# Patient Record
Sex: Male | Born: 1940 | Race: White | Hispanic: No | Smoking: Current every day smoker
Health system: Southern US, Community
[De-identification: ages and names within clinical notes are randomized; demographics above are authoritative.]

## PROBLEM LIST (undated history)

## (undated) DIAGNOSIS — J449 Chronic obstructive pulmonary disease, unspecified: Secondary | ICD-10-CM

## (undated) DIAGNOSIS — I10 Essential (primary) hypertension: Secondary | ICD-10-CM

## (undated) DIAGNOSIS — I2510 Atherosclerotic heart disease of native coronary artery without angina pectoris: Secondary | ICD-10-CM

## (undated) DIAGNOSIS — I219 Acute myocardial infarction, unspecified: Secondary | ICD-10-CM

## (undated) HISTORY — PX: BACK SURGERY: SHX140

## (undated) HISTORY — PX: CARDIAC SURGERY: SHX584

## (undated) HISTORY — PX: HIP SURGERY: SHX245

---

## 2006-11-13 ENCOUNTER — Emergency Department: Payer: Self-pay | Admitting: Internal Medicine

## 2006-11-18 ENCOUNTER — Inpatient Hospital Stay: Payer: Self-pay | Admitting: Internal Medicine

## 2006-11-21 ENCOUNTER — Other Ambulatory Visit: Payer: Self-pay

## 2006-11-23 ENCOUNTER — Other Ambulatory Visit: Payer: Self-pay

## 2006-12-18 ENCOUNTER — Ambulatory Visit: Payer: Self-pay | Admitting: Emergency Medicine

## 2007-04-02 ENCOUNTER — Emergency Department: Payer: Self-pay | Admitting: Emergency Medicine

## 2007-04-14 ENCOUNTER — Ambulatory Visit: Payer: Self-pay | Admitting: Unknown Physician Specialty

## 2007-05-13 ENCOUNTER — Ambulatory Visit: Payer: Self-pay | Admitting: Internal Medicine

## 2009-05-19 ENCOUNTER — Ambulatory Visit: Payer: Self-pay | Admitting: Unknown Physician Specialty

## 2009-05-22 ENCOUNTER — Inpatient Hospital Stay: Payer: Self-pay | Admitting: Unknown Physician Specialty

## 2012-12-30 ENCOUNTER — Emergency Department: Payer: Self-pay | Admitting: Emergency Medicine

## 2012-12-31 LAB — BASIC METABOLIC PANEL
Anion Gap: 6 — ABNORMAL LOW (ref 7–16)
EGFR (Non-African Amer.): >60
Glucose: 107 mg/dL — ABNORMAL HIGH (ref 65–99)
Osmolality: 280 (ref 275–301)
Potassium: 3.7 mmol/L (ref 3.5–5.1)
Sodium: 140 mmol/L (ref 136–145)

## 2012-12-31 LAB — CBC
HCT: 40.2 % (ref 40.0–52.0)
MCHC: 34.4 g/dL (ref 32.0–36.0)
MCV: 89 fL (ref 80–100)
Platelet: 135 10*3/uL — ABNORMAL LOW (ref 150–440)
RBC: 4.51 10*6/uL (ref 4.40–5.90)

## 2012-12-31 LAB — PRO B NATRIURETIC PEPTIDE: B-Type Natriuretic Peptide: 386 pg/mL — ABNORMAL HIGH (ref 0–125)

## 2012-12-31 LAB — TROPONIN I: Troponin-I: <0.02 ng/mL

## 2013-01-05 LAB — CULTURE, BLOOD (SINGLE)

## 2014-02-15 ENCOUNTER — Emergency Department: Payer: Self-pay | Admitting: Emergency Medicine

## 2014-02-15 LAB — DRUG SCREEN, URINE

## 2014-02-15 LAB — COMPREHENSIVE METABOLIC PANEL
ALBUMIN: 3.6 g/dL (ref 3.4–5.0)
ALK PHOS: 100 U/L
ALT: 19 U/L
ANION GAP: 6 — AB (ref 7–16)
AST: 26 U/L (ref 15–37)
BILIRUBIN TOTAL: 0.5 mg/dL (ref 0.2–1.0)
BUN: 14 mg/dL (ref 7–18)
CALCIUM: 8.7 mg/dL (ref 8.5–10.1)
CHLORIDE: 112 mmol/L — AB (ref 98–107)
CO2: 24 mmol/L (ref 21–32)
Creatinine: 1.32 mg/dL — ABNORMAL HIGH (ref 0.60–1.30)
EGFR (Non-African Amer.): 56 — ABNORMAL LOW
GLUCOSE: 109 mg/dL — AB (ref 65–99)
Osmolality: 284 (ref 275–301)
POTASSIUM: 4.1 mmol/L (ref 3.5–5.1)
Sodium: 142 mmol/L (ref 136–145)
Total Protein: 7.7 g/dL (ref 6.4–8.2)

## 2014-02-15 LAB — URINALYSIS, COMPLETE
BLOOD: NEGATIVE
Bilirubin,UR: NEGATIVE
Glucose,UR: NEGATIVE mg/dL (ref 0–75)
Granular Cast: 6
Hyaline Cast: 63
NITRITE: NEGATIVE
Ph: 5 (ref 4.5–8.0)
Protein: 30
RBC,UR: 8 /HPF (ref 0–5)
SPECIFIC GRAVITY: 1.028 (ref 1.003–1.030)
WBC UR: 10 /HPF (ref 0–5)

## 2014-02-15 LAB — ETHANOL

## 2014-02-15 LAB — CBC
HCT: 45.3 % (ref 40.0–52.0)
HGB: 15 g/dL (ref 13.0–18.0)
MCH: 29.5 pg (ref 26.0–34.0)
MCHC: 33.1 g/dL (ref 32.0–36.0)
MCV: 89 fL (ref 80–100)
PLATELETS: 171 10*3/uL (ref 150–440)
RBC: 5.08 10*6/uL (ref 4.40–5.90)
RDW: 16.1 % — AB (ref 11.5–14.5)
WBC: 11.2 10*3/uL — ABNORMAL HIGH (ref 3.8–10.6)

## 2014-02-15 LAB — SALICYLATE LEVEL: SALICYLATES, SERUM: 2.6 mg/dL

## 2014-02-15 LAB — ACETAMINOPHEN LEVEL: Acetaminophen: <2 ug/mL

## 2014-05-03 ENCOUNTER — Emergency Department: Payer: Self-pay | Admitting: Emergency Medicine

## 2015-04-12 ENCOUNTER — Emergency Department
Admission: EM | Admit: 2015-04-12 | Discharge: 2015-04-14 | Disposition: A | Discharge status: Other Acute Inpatient Hospital | Payer: Medicare Other | Attending: Emergency Medicine | Admitting: Emergency Medicine

## 2015-04-12 ENCOUNTER — Encounter: Payer: Self-pay | Admitting: Emergency Medicine

## 2015-04-12 DIAGNOSIS — R45851 Suicidal ideations: Secondary | ICD-10-CM | POA: Diagnosis present

## 2015-04-12 DIAGNOSIS — Z7951 Long term (current) use of inhaled steroids: Secondary | ICD-10-CM | POA: Insufficient documentation

## 2015-04-12 DIAGNOSIS — Z7952 Long term (current) use of systemic steroids: Secondary | ICD-10-CM | POA: Diagnosis not present

## 2015-04-12 DIAGNOSIS — M25559 Pain in unspecified hip: Secondary | ICD-10-CM

## 2015-04-12 DIAGNOSIS — F322 Major depressive disorder, single episode, severe without psychotic features: Secondary | ICD-10-CM

## 2015-04-12 DIAGNOSIS — I10 Essential (primary) hypertension: Secondary | ICD-10-CM | POA: Insufficient documentation

## 2015-04-12 DIAGNOSIS — G8929 Other chronic pain: Secondary | ICD-10-CM

## 2015-04-12 DIAGNOSIS — Z792 Long term (current) use of antibiotics: Secondary | ICD-10-CM | POA: Insufficient documentation

## 2015-04-12 DIAGNOSIS — Z79899 Other long term (current) drug therapy: Secondary | ICD-10-CM | POA: Diagnosis not present

## 2015-04-12 DIAGNOSIS — F17210 Nicotine dependence, cigarettes, uncomplicated: Secondary | ICD-10-CM | POA: Insufficient documentation

## 2015-04-12 DIAGNOSIS — J449 Chronic obstructive pulmonary disease, unspecified: Secondary | ICD-10-CM

## 2015-04-12 DIAGNOSIS — F332 Major depressive disorder, recurrent severe without psychotic features: Secondary | ICD-10-CM | POA: Diagnosis not present

## 2015-04-12 HISTORY — DX: Essential (primary) hypertension: I10

## 2015-04-12 HISTORY — DX: Atherosclerotic heart disease of native coronary artery without angina pectoris: I25.10

## 2015-04-12 HISTORY — DX: Chronic obstructive pulmonary disease, unspecified: J44.9

## 2015-04-12 LAB — CBC
HEMATOCRIT: 46.5 % (ref 40.0–52.0)
HEMOGLOBIN: 15.7 g/dL (ref 13.0–18.0)
MCH: 28.8 pg (ref 26.0–34.0)
MCHC: 33.8 g/dL (ref 32.0–36.0)
MCV: 85.2 fL (ref 80.0–100.0)
Platelets: 150 10*3/uL (ref 150–440)
RBC: 5.45 MIL/uL (ref 4.40–5.90)
RDW: 15.7 % — ABNORMAL HIGH (ref 11.5–14.5)
WBC: 12.4 10*3/uL — AB (ref 3.8–10.6)

## 2015-04-12 LAB — URINE DRUG SCREEN, QUALITATIVE (ARMC ONLY)
AMPHETAMINES, UR SCREEN: NOT DETECTED
BARBITURATES, UR SCREEN: NOT DETECTED
Benzodiazepine, Ur Scrn: NOT DETECTED
COCAINE METABOLITE, UR ~~LOC~~: NOT DETECTED
Cannabinoid 50 Ng, Ur ~~LOC~~: NOT DETECTED
MDMA (ECSTASY) UR SCREEN: NOT DETECTED
METHADONE SCREEN, URINE: NOT DETECTED
OPIATE, UR SCREEN: NOT DETECTED
Phencyclidine (PCP) Ur S: NOT DETECTED
TRICYCLIC, UR SCREEN: NOT DETECTED

## 2015-04-12 LAB — COMPREHENSIVE METABOLIC PANEL
ALBUMIN: 4.2 g/dL (ref 3.5–5.0)
ALK PHOS: 113 U/L (ref 38–126)
ALT: 22 U/L (ref 17–63)
ANION GAP: 7 (ref 5–15)
AST: 26 U/L (ref 15–41)
BUN: 13 mg/dL (ref 6–20)
CALCIUM: 9.2 mg/dL (ref 8.9–10.3)
CHLORIDE: 105 mmol/L (ref 101–111)
CO2: 26 mmol/L (ref 22–32)
Creatinine, Ser: 1.32 mg/dL — ABNORMAL HIGH (ref 0.61–1.24)
GFR calc non Af Amer: 51 mL/min — ABNORMAL LOW (ref >60)
GFR, EST AFRICAN AMERICAN: 59 mL/min — AB (ref >60)
GLUCOSE: 112 mg/dL — AB (ref 65–99)
Potassium: 4.3 mmol/L (ref 3.5–5.1)
Sodium: 138 mmol/L (ref 135–145)
Total Bilirubin: 0.7 mg/dL (ref 0.3–1.2)
Total Protein: 7.7 g/dL (ref 6.5–8.1)

## 2015-04-12 LAB — ACETAMINOPHEN LEVEL

## 2015-04-12 LAB — SALICYLATE LEVEL

## 2015-04-12 LAB — ETHANOL: Alcohol, Ethyl (B): <5 mg/dL (ref <5)

## 2015-04-12 NOTE — ED Notes (Signed)
Housemates called 911 because patient was locked up in the backroom with a rope and talking about killing himself.  Patients daughter was shot and killed 2 days ago and he has been feeling suicidal.  Never had rope around his neck, was talked down by housemates.  Patient states that he wants the people he lives with out of his house as well.  Denies history of harming himself.

## 2015-04-13 DIAGNOSIS — F332 Major depressive disorder, recurrent severe without psychotic features: Secondary | ICD-10-CM | POA: Diagnosis not present

## 2015-04-13 DIAGNOSIS — Z792 Long term (current) use of antibiotics: Secondary | ICD-10-CM | POA: Diagnosis not present

## 2015-04-13 DIAGNOSIS — R45851 Suicidal ideations: Secondary | ICD-10-CM | POA: Diagnosis present

## 2015-04-13 DIAGNOSIS — F322 Major depressive disorder, single episode, severe without psychotic features: Secondary | ICD-10-CM | POA: Diagnosis not present

## 2015-04-13 DIAGNOSIS — I10 Essential (primary) hypertension: Secondary | ICD-10-CM

## 2015-04-13 DIAGNOSIS — J449 Chronic obstructive pulmonary disease, unspecified: Secondary | ICD-10-CM

## 2015-04-13 DIAGNOSIS — F17210 Nicotine dependence, cigarettes, uncomplicated: Secondary | ICD-10-CM | POA: Diagnosis not present

## 2015-04-13 DIAGNOSIS — G8929 Other chronic pain: Secondary | ICD-10-CM

## 2015-04-13 DIAGNOSIS — Z79899 Other long term (current) drug therapy: Secondary | ICD-10-CM | POA: Diagnosis not present

## 2015-04-13 DIAGNOSIS — Z7952 Long term (current) use of systemic steroids: Secondary | ICD-10-CM | POA: Diagnosis not present

## 2015-04-13 DIAGNOSIS — M25559 Pain in unspecified hip: Secondary | ICD-10-CM

## 2015-04-13 DIAGNOSIS — Z7951 Long term (current) use of inhaled steroids: Secondary | ICD-10-CM | POA: Diagnosis not present

## 2015-04-13 MED ORDER — ACETAMINOPHEN 325 MG PO TABS
650.0000 mg | ORAL_TABLET | Freq: Four times a day (QID) | ORAL | Status: DC | PRN
  Administered 2015-04-13: 650 mg via ORAL
  Filled 2015-04-13: qty 2

## 2015-04-13 MED ORDER — LISINOPRIL 20 MG PO TABS
40.0000 mg | ORAL_TABLET | Freq: Every day | ORAL | Status: DC
  Administered 2015-04-13: 40 mg via ORAL
  Filled 2015-04-13: qty 1

## 2015-04-13 MED ORDER — ALPRAZOLAM 0.5 MG PO TABS
0.5000 mg | ORAL_TABLET | ORAL | Status: AC
  Administered 2015-04-13: 0.5 mg via ORAL
  Filled 2015-04-13: qty 1

## 2015-04-13 MED ORDER — LORATADINE 10 MG PO TABS
10.0000 mg | ORAL_TABLET | Freq: Every day | ORAL | Status: DC
  Administered 2015-04-13: 10 mg via ORAL
  Filled 2015-04-13: qty 1

## 2015-04-13 MED ORDER — NICOTINE POLACRILEX 2 MG MT GUM: 4.0000 mg | CHEWING_GUM | OROMUCOSAL | Status: DC | PRN

## 2015-04-13 MED ORDER — GABAPENTIN 100 MG PO CAPS
100.0000 mg | ORAL_CAPSULE | Freq: Two times a day (BID) | ORAL | Status: DC
  Administered 2015-04-13 (×2): 100 mg via ORAL
  Filled 2015-04-13 (×4): qty 1

## 2015-04-13 MED ORDER — IPRATROPIUM-ALBUTEROL 0.5-2.5 (3) MG/3ML IN SOLN
3.0000 mL | RESPIRATORY_TRACT | Status: DC | PRN
  Administered 2015-04-13: 3 mL via RESPIRATORY_TRACT
  Filled 2015-04-13 (×2): qty 3

## 2015-04-13 MED ORDER — DULOXETINE HCL 30 MG PO CPEP
30.0000 mg | ORAL_CAPSULE | Freq: Every day | ORAL | Status: DC
  Administered 2015-04-13: 30 mg via ORAL
  Filled 2015-04-13: qty 1

## 2015-04-13 MED ORDER — NICOTINE POLACRILEX 2 MG MT GUM
2.0000 mg | CHEWING_GUM | OROMUCOSAL | Status: DC | PRN
  Administered 2015-04-13: 2 mg via ORAL
  Filled 2015-04-13: qty 1

## 2015-04-13 MED ORDER — PREDNISONE 20 MG PO TABS
20.0000 mg | ORAL_TABLET | Freq: Every day | ORAL | Status: DC
  Administered 2015-04-13: 20 mg via ORAL
  Filled 2015-04-13: qty 1

## 2015-04-13 NOTE — ED Notes (Signed)
Spoke with William LoserLaura Collins who presents to ED with a copy of Durable Power of [facility] for Mr William Wilcox. She reports pt has hx of dementia and is [facility] [place] [facility] (Dr Marcello FennelJakel). She also reports pt has recently lost his daughter who was murdered and since pt has been talking of harming himself. Information also given on patients home medications. All this information relayed to Dr Fanny BienQuale and to TTS.

## 2015-04-13 NOTE — ED Notes (Signed)

## 2015-04-13 NOTE — ED Notes (Signed)
Pt gave permission for this RN to retrieve wallet and envelope from pts belongings and give to POA, Bjorn LoserLaura Collins.

## 2015-04-13 NOTE — ED Notes (Signed)

## 2015-04-13 NOTE — ED Notes (Signed)
BEHAVIORAL HEALTH ROUNDING  Patient sleeping: No.  Patient alert and oriented: yes  Behavior appropriate: Yes. ; If no, describe:  Nutrition and fluids offered: Yes  Toileting and hygiene offered: Yes  Sitter present: not applicable, Q 15 min safety rounds and observation.  Law enforcement present: Yes ODS  

## 2015-04-13 NOTE — BH Specialist Note (Signed)
TTS spoke with [place], AOD at [facility].  [phone].  He states that [facility] [employer] [facility].  He states that no paperwork is being reviewed for beds at this time.

## 2015-04-13 NOTE — ED Notes (Signed)
BEHAVIORAL HEALTH ROUNDING Patient sleeping: Yes.   Patient alert and oriented: not applicable SLEEPING Behavior appropriate: Yes.  ; If no, describe: SLEEPING Nutrition and fluids offered: No SLEEPING Toileting and hygiene offered: NoSLEEPING Sitter present: not applicable, Q 15 min safety rounds and observation. Law enforcement present: Yes ODS 

## 2015-04-13 NOTE — ED Notes (Signed)
Patient resting quietly in room. No noted distress or abnormal behaviors noted. Will continue 15 minute checks and observation by security camera for safety. 

## 2015-04-13 NOTE — ED Notes (Signed)
Report received from Amy RN  

## 2015-04-13 NOTE — ED Notes (Signed)

## 2015-04-13 NOTE — BH Assessment (Signed)
Per Dr. PucilowsJennet Wilcox - patient meets criteria for inpatient hospitalization.  Writer referred patient to [facility].

## 2015-04-13 NOTE — BH Assessment (Addendum)
Writer referred the [facility] to the [facility].  Per William Wilcox, referral can be faxed to [phone].  [facility] was informed by the [facility] that his entire SS number has to be listed in the referral.

## 2015-04-13 NOTE — ED Notes (Signed)
Breakfast tray given, patient awake and eating at this time.

## 2015-04-13 NOTE — ED Notes (Signed)
Patient's money retrieved to give to power of attorney. $171.00, counted and witnessed by two RNs.

## 2015-04-13 NOTE — Consult Note (Signed)
[facility] Consult   Reason for Consult:  Consult for this 75 year old man who presents to the hospital under petition because of suicidal thoughts and behavior Referring Physician:  Corky Downs Patient Identification: ADIS STURGILL MRN:  [id number] Principal Diagnosis: Major depressive disorder, single episode, severe without psychotic features [facility]) Diagnosis:   Patient Active Problem List   Diagnosis Date Noted  . Major depressive disorder, single episode, severe without psychotic features ([place]) [F32.2] 04/13/2015  . COPD (chronic obstructive pulmonary disease) ([place]) [J44.9] 04/13/2015  . 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 Hypertension [I10] 04/13/2015    Total Time spent with patient: 1 hour  Subjective:   DARIO YONO is a 75 y.o. male patient admitted with "they thought I was going to kill myself. My daughter died".  HPI:  Information from an interview with the patient, review of the chart, discussion with nursing staff and intake staff, review of labs and review with the emergency room physician. This 75 year old man was brought to the emergency room by law enforcement. Apparently the people who live with him called 911 because he was stating that he was going to hang himself. I asked him why they thought he was going to do that and he said it was because he had a cord wrapped around his neck. Apparently he actually told law enforcement that he was going to hang himself. Patient minimizes the seriousness of this but admits that his mood is terrible. His daughter died in a robbery earlier this week. He is overwhelmed with grief. For the last several days he has not been eating and not been sleeping. Stopped taking all of his medicine about a month ago by what he says. Has a hard time giving me a really clear time course of this but it sounds like he may have been having some depression even before his daughter died. He says he can't think clearly and feels like  his mind is just jumbled up he denies that he's been drinking or abusing drugs. Has not been taking prescribed medicine including what sounds like medicine prescribed for depression. Other major stresses include being unhappy with the people who live with them. Chronic pain in his hip. Patient tells me that he still feels like he has nothing to live for and admits that he told law enforcement that he was going to kill himself.  Social history: Patient has his own house apparently but he has people living in an whom he barely knows. He met them somewhere and just invited them to live with him. Now he regrets that and is angry with them. He has had 3 daughters one of whom just died earlier this week. His wife also died several years ago. It sounds like he is at least partially estranged from his other 2 daughters although he admits he has spoken to them this week. Patient is a Geologist, engineering. Gets his outpatient medical care at the [place].  Medical history: Patient primarily complains of chronic pain in his hip. Says that he had a hip replacement years ago and has been in pain ever since. Also high blood pressure and COPD and tells me that he has had a coronary bypass operation as well. He has said that he stopped taking all of his medicine a month ago.  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 Denies recent drinking and denies other drug use.  Past Psychiatric History: From what the  patient tells me he has been seen by a psychiatrist at the [facility] and was prescribed medication at the recommendation of his primary care doctor, but has never been admitted to a psychiatric hospital that he will admit to. Says that he's never tried to kill himself in the past. Can't remember any medicine he's been prescribed. Doesn't report a history of mania.  Risk to Self: Suicidal Ideation:  (Denies suicidal ideation, but others report suicidal  intent) Suicidal Intent:  (Denied, but others state that he has suicidal intent) Is patient at risk for suicide?: Yes Suicidal Plan?:  (To hang himself) Access to Means: Yes Specify Access to Suicidal Means: rope at home What has been your use of drugs/alcohol within the last 12 months?: denied use of alchol or drugs How many times?: 0 Other Self Harm Risks: Denied Triggers for Past Attempts: Unknown Intentional Self Injurious Behavior: None Risk to Others: Homicidal Ideation: No Thoughts of Harm to Others: No Current Homicidal Intent: No Current Homicidal Plan: No Access to Homicidal Means: No Identified Victim: None reported History of harm to others?: No Assessment of Violence: None Noted Violent Behavior Description: denied Does patient have access to weapons?: No Criminal Charges Pending?: No Does patient have a court date: No Prior Inpatient Therapy: Prior Inpatient Therapy: No Prior Outpatient Therapy: Prior Outpatient Therapy: No Does patient have an ACCT team?: No Does patient have Intensive In-House Services?  : No Does patient have [facility] services? : No Does patient have P4CC services?: No  Past Medical History:  Past Medical History  Diagnosis Date  . Hypertension   . COPD (chronic obstructive pulmonary disease) ([place])   . Coronary artery disease     Past Surgical History  Procedure Laterality Date  . Back surgery    . Cardiac surgery      triple bypass  . Hip surgery Left    Family History: No family history on file. Family Psychiatric  History: Denies any family history of mental illness Social History:  History  Alcohol Use No     History  Drug Use No    Social History   Social History  . Marital Status: Married    Spouse Name: N/A  . Number of Children: N/A  . Years of Education: N/A   Social History Main Topics  . Smoking status: Current Every Day Smoker -- 2.00 packs/day for 57 years    Types: Cigarettes  . Smokeless tobacco: None  .  Alcohol Use: No  . Drug Use: No  . Sexual Activity: Not Asked   Other Topics Concern  . None   Social History Narrative  . None   Additional Social History:    History of alcohol / drug use?: No history of alcohol / drug abuse (Denied)                     Allergies:  No Known Allergies  Labs:  Results for orders placed or performed during the hospital encounter of 04/12/15 (from the past 48 hour(s))  Comprehensive metabolic panel     Status: Abnormal   Collection Time: 04/12/15  9:21 PM  Result Value Ref Range   Sodium 138 135 - 145 mmol/L   Potassium 4.3 3.5 - 5.1 mmol/L   Chloride 105 101 - 111 mmol/L   CO2 26 22 - 32 mmol/L   Glucose, Bld 112 (H) 65 - 99 mg/dL   BUN 13 6 - 20 mg/dL   Creatinine, Ser 1.32 (H)  0.61 - 1.24 mg/dL   Calcium 9.2 8.9 - 10.3 mg/dL   Total Protein 7.7 6.5 - 8.1 g/dL   Albumin 4.2 3.5 - 5.0 g/dL   AST 26 15 - 41 U/L   ALT 22 17 - 63 U/L   Alkaline Phosphatase 113 38 - 126 U/L   Total Bilirubin 0.7 0.3 - 1.2 mg/dL   GFR calc non Af Amer 51 (L) >60 mL/min   GFR calc Af Amer 59 (L) >60 mL/min    Comment: (NOTE) The eGFR has been calculated using the CKD EPI equation. This calculation has not been validated in all clinical situations. eGFR's persistently <60 mL/min signify possible Chronic Kidney Disease.    Anion gap 7 5 - 15  Ethanol (ETOH)     Status: None   Collection Time: 04/12/15  9:21 PM  Result Value Ref Range   Alcohol, Ethyl (B) <5 <5 mg/dL    Comment:        LOWEST DETECTABLE LIMIT FOR SERUM ALCOHOL IS 5 mg/dL FOR MEDICAL PURPOSES ONLY   Salicylate level     Status: None   Collection Time: 04/12/15  9:21 PM  Result Value Ref Range   Salicylate Lvl <8.1 2.8 - 30.0 mg/dL  Acetaminophen level     Status: Abnormal   Collection Time: 04/12/15  9:21 PM  Result Value Ref Range   Acetaminophen (Tylenol), Serum <10 (L) 10 - 30 ug/mL    Comment:        THERAPEUTIC CONCENTRATIONS VARY SIGNIFICANTLY. A RANGE OF  10-30 ug/mL MAY BE AN EFFECTIVE CONCENTRATION FOR MANY PATIENTS. HOWEVER, SOME ARE BEST TREATED AT CONCENTRATIONS OUTSIDE THIS RANGE. ACETAMINOPHEN CONCENTRATIONS >150 ug/mL AT 4 HOURS AFTER INGESTION AND >50 ug/mL AT 12 HOURS AFTER INGESTION ARE OFTEN ASSOCIATED WITH TOXIC REACTIONS.   CBC     Status: Abnormal   Collection Time: 04/12/15  9:21 PM  Result Value Ref Range   WBC 12.4 (H) 3.8 - 10.6 K/uL   RBC 5.45 4.40 - 5.90 MIL/uL   Hemoglobin 15.7 13.0 - 18.0 g/dL   HCT 46.5 40.0 - 52.0 %   MCV 85.2 80.0 - 100.0 fL   MCH 28.8 26.0 - 34.0 pg   MCHC 33.8 32.0 - 36.0 g/dL   RDW 15.7 (H) 11.5 - 14.5 %   Platelets 150 150 - 440 K/uL  Urine Drug Screen, Qualitative ([facility] only)     Status: None   Collection Time: 04/12/15  9:21 PM  Result Value Ref Range   Tricyclic, Ur Screen NONE DETECTED NONE DETECTED   Amphetamines, Ur Screen NONE DETECTED NONE DETECTED   MDMA (Ecstasy)Ur Screen NONE DETECTED NONE DETECTED   Cocaine Metabolite,Ur [place] NONE DETECTED NONE DETECTED   Opiate, Ur Screen NONE DETECTED NONE DETECTED   Phencyclidine (PCP) Ur S NONE DETECTED NONE DETECTED   Cannabinoid 50 Ng, Ur [place] NONE DETECTED NONE DETECTED   Barbiturates, Ur Screen NONE DETECTED NONE DETECTED   Benzodiazepine, Ur Scrn NONE DETECTED NONE DETECTED   Methadone Scn, Ur NONE DETECTED NONE DETECTED    Comment: (NOTE) 856  Tricyclics, urine               Cutoff 1000 ng/mL 200  Amphetamines, urine             Cutoff 1000 ng/mL 300  MDMA (Ecstasy), urine           Cutoff 500 ng/mL 400  Cocaine Metabolite, urine       Cutoff 300 ng/mL  500  Opiate, urine                   Cutoff 300 ng/mL 600  Phencyclidine (PCP), urine      Cutoff 25 ng/mL 700  Cannabinoid, urine              Cutoff 50 ng/mL 800  Barbiturates, urine             Cutoff 200 ng/mL 900  Benzodiazepine, urine           Cutoff 200 ng/mL 1000 Methadone, urine                Cutoff 300 ng/mL 1100 1200 The urine drug screen provides only a  preliminary, unconfirmed 1300 analytical test result and should not be used for non-medical 1400 purposes. Clinical consideration and professional judgment should 1500 be applied to any positive drug screen result due to possible 1600 interfering substances. A more specific alternate chemical method 1700 must be used in order to obtain a confirmed analytical result.  1800 Gas chromato graphy / mass spectrometry (GC/MS) is the preferred 1900 confirmatory method.     Current Facility-Administered Medications  Medication Dose Route Frequency Provider Last Rate Last Dose  . acetaminophen (TYLENOL) tablet 650 mg  650 mg Oral Q6H PRN Delman Kitten, MD   650 mg at 04/13/15 0937  . ALPRAZolam (XANAX) tablet 0.5 mg  0.5 mg Oral STAT Gonzella Lex, MD      . DULoxetine (CYMBALTA) DR capsule 30 mg  30 mg Oral Daily Delman Kitten, MD   30 mg at 04/13/15 [phone]  . gabapentin (NEURONTIN) capsule 100 mg  100 mg Oral BID Delman Kitten, MD   100 mg at 04/13/15 5852  . ipratropium-albuterol (DUONEB) 0.5-2.5 (3) MG/3ML nebulizer solution 3 mL  3 mL Nebulization Q4H PRN Delman Kitten, MD   3 mL at 04/13/15 0950  . lisinopril (PRINIVIL,ZESTRIL) tablet 40 mg  40 mg Oral Daily Delman Kitten, MD   40 mg at 04/13/15 0937  . loratadine (CLARITIN) tablet 10 mg  10 mg Oral Daily Delman Kitten, MD   10 mg at 04/13/15 0939  . predniSONE (DELTASONE) tablet 20 mg  20 mg Oral Q breakfast Delman Kitten, MD   20 mg at 04/13/15 7782   Current Outpatient Prescriptions  Medication Sig Dispense Refill  . acetaminophen (TYLENOL) 325 MG tablet Take 650 mg by mouth.    Marland Kitchen albuterol (PROVENTIL HFA;VENTOLIN HFA) 108 (90 Base) MCG/ACT inhaler Inhale into the lungs.    Marland Kitchen ammonium lactate (LAC-HYDRIN) 12 % lotion Apply 1 application topically as needed for dry skin.    Marland Kitchen doxycycline (VIBRAMYCIN) 100 MG capsule Take 100 mg by mouth.    . DULoxetine (CYMBALTA) 30 MG capsule Take 30 mg by mouth daily.    Marland Kitchen gabapentin (NEURONTIN) 100 MG capsule Take 100 mg by  mouth.    Marland Kitchen guaiFENesin-dextromethorphan (ROBITUSSIN DM) 100-10 MG/5ML syrup Take 5 mLs by mouth.    Marland Kitchen ipratropium-albuterol (DUONEB) 0.5-2.5 (3) MG/3ML SOLN Take 3 mLs by nebulization every 4 (four) hours as needed.    Marland Kitchen lisinopril (PRINIVIL,ZESTRIL) 40 MG tablet Take 40 mg by mouth daily.    Marland Kitchen loratadine (CLARITIN) 10 MG tablet Take 10 mg by mouth daily.    . predniSONE (DELTASONE) 20 MG tablet Take 20 mg by mouth.    . simvastatin (ZOCOR) 80 MG tablet Take 80 mg by mouth every evening.    . triamcinolone cream (KENALOG) 0.1 %  Apply 1 application topically.      Musculoskeletal: Strength & Muscle Tone: decreased Gait & Station: normal Patient leans: N/A  Psychiatric Specialty Exam: Review of Systems  Constitutional: Negative.   Eyes: Negative.   Respiratory: Negative.   Cardiovascular: Negative.   Gastrointestinal: Negative.   Musculoskeletal: Negative.   Skin: Negative.   Neurological: Positive for headaches.  Psychiatric/Behavioral: Positive for depression, suicidal ideas and memory loss. Negative for hallucinations and substance abuse. The patient is nervous/anxious and has insomnia.     Blood pressure 139/69, pulse 77, temperature 98.6 F (37 C), temperature source Oral, resp. rate 18, height 5' 6" (1.676 m), weight 56.7 kg (125 lb), SpO2 94 %.Body mass index is 20.19 kg/(m^2).  General Appearance: Disheveled  Eye Contact::  Minimal  Speech:  Garbled  Volume:  Normal  Mood:  Depressed and Dysphoric  Affect:  Depressed and Labile  Thought Process:  A little bit scattered but he is able to give a pretty good history  Orientation:  Full (Time, Place, and Person)  Thought Content:  Negative  Suicidal Thoughts:  Yes.  with intent/plan  Homicidal Thoughts:  No  Memory:  Immediate;   Good Recent;   Fair Remote;   Fair  Judgement:  Impaired  Insight:  Shallow  Psychomotor Activity:  Restlessness  Concentration:  Fair  Recall:  [employer] of Knowledge:Fair  Language:  Fair  Akathisia:  No  Handed:  Right  AIMS (if indicated):     Assets:  Agricultural consultant Housing Resilience  ADL's:  Intact  Cognition: WNL  Sleep:      Treatment Plan Summary: Daily contact with patient to assess and evaluate symptoms and progress in treatment, Medication management and Plan 75 year old man presents with multiple symptoms of major depression. Mood has been down and depressed and hopeless probably for weeks but at least since his daughter died. He is not sleeping well if at all. He is not eating well or taking care of himself. He is no longer taking any of his medication despite serious medical problems. He indicated to his friends that he was going to hang himself and when law enforcement came to the house he repeated this claim to them and repeats it to me although he says that he would never actually follow through on it. Patient does not appear to be having psychotic symptoms but does appear to be overwhelmed and anxious. Multiple serious risk factors for suicide are present. Patient requires inpatient hospital level treatment. We have started him back on the list of medications presumably that we have confirmed from his outpatient pharmacy including Cymbalta. 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 Case discussed with TTS and we will try to contact the [place] [facility] system. If they have a bed available for them we will arrange transport to the [place] system. If they are not able to provide inpatient care we can admit him to our hospital as he is ambulatory and seems to be in otherwise reasonable health and not particularly demented. I have ordered a low dose of Xanax Times one because he is so anxious and distraught right now. Otherwise continue current medicine. Labs reviewed. Case discussed with ER doctor.  Disposition: Recommend psychiatric Inpatient admission when medically cleared. Supportive therapy provided about ongoing  stressors.  Kariem Wolfson 04/13/2015 3:30 PM

## 2015-04-13 NOTE — ED Notes (Signed)
Received report from Monica RN

## 2015-04-13 NOTE — BH Assessment (Signed)
Writer faxed referrals to  [facility]  [facility]  [facility]

## 2015-04-13 NOTE — ED Provider Notes (Signed)
[facility] Emergency Department Provider Note REMINDER - THIS NOTE IS NOT A FINAL MEDICAL RECORD UNTIL IT IS SIGNED. UNTIL THEN, THE CONTENT BELOW MAY REFLECT INFORMATION FROM A DOCUMENTATION TEMPLATE, NOT THE ACTUAL PATIENT VISIT. ____________________________________________  Time seen: Approximately 12:34 AM  I have reviewed the triage vital signs and the nursing notes.   HISTORY  Chief Complaint Suicidal    HPI William Wilcox is a 75 y.o. male presents for evaluation of suicidal plan, potential attempt. The patient lost his daughter to a homicide 2 days ago which was will published in her local knees favor.  Reports he has been very depressed, feeling suicidal and then today had wanted to hang himself with a dog leash. The present time he states that he thinks this was foolish and no longer wishes to harm himself, but he does report he feels very sad and is depressed as lost his daughter.  He denies any fevers chills or recent concerns. Review his records does show that he is currently on prednisone, and I will continue this. He denies any cough fevers chills or trouble breathing at this time. He is followed at the [facility] for severe depression.  [place] overdose. Past Medical History  Diagnosis Date  . Hypertension   . COPD (chronic obstructive pulmonary disease) (HCC)   . Coronary artery disease     There are no active problems to display for this patient.   Past Surgical History  Procedure Laterality Date  . Back surgery    . Cardiac surgery      triple bypass  . Hip surgery Left     Current Outpatient Rx  Name  Route  Sig  Dispense  Refill  . acetaminophen (TYLENOL) 325 MG tablet   Oral   Take 650 mg by mouth.         Marland Kitchen albuterol (PROVENTIL HFA;VENTOLIN HFA) 108 (90 Base) MCG/ACT inhaler   Inhalation   Inhale into the lungs.         Marland Kitchen ammonium lactate (LAC-HYDRIN) 12 % lotion   Topical   Apply 1 application topically  as needed for dry skin.         Marland Kitchen doxycycline (VIBRAMYCIN) 100 MG capsule   Oral   Take 100 mg by mouth.         . DULoxetine (CYMBALTA) 30 MG capsule   Oral   Take 30 mg by mouth daily.         Marland Kitchen gabapentin (NEURONTIN) 100 MG capsule   Oral   Take 100 mg by mouth.         Marland Kitchen guaiFENesin-dextromethorphan (ROBITUSSIN DM) 100-10 MG/5ML syrup   Oral   Take 5 mLs by mouth.         Marland Kitchen ipratropium-albuterol (DUONEB) 0.5-2.5 (3) MG/3ML SOLN   Nebulization   Take 3 mLs by nebulization every 4 (four) hours as needed.         Marland Kitchen lisinopril (PRINIVIL,ZESTRIL) 40 MG tablet   Oral   Take 40 mg by mouth daily.         Marland Kitchen loratadine (CLARITIN) 10 MG tablet   Oral   Take 10 mg by mouth daily.         . predniSONE (DELTASONE) 20 MG tablet   Oral   Take 20 mg by mouth.         . simvastatin (ZOCOR) 80 MG tablet   Oral   Take 80 mg by mouth every evening.         Marland Kitchen  triamcinolone cream (KENALOG) 0.1 %   Topical   Apply 1 application topically.           Allergies Review of patient's allergies indicates no known allergies.  No family history on file.  Social History Social History  Substance Use Topics  . Smoking status: Current Every Day Smoker -- 2.00 packs/day for 57 years    Types: Cigarettes  . Smokeless tobacco: None  . Alcohol Use: No    Review of Systems Constitutional: No fever/chills Eyes: No visual changes. ENT: No sore throat. Cardiovascular: Denies chest pain. Respiratory: Denies shortness of breath. Gastrointestinal: No abdominal pain.  No nausea, no vomiting.  No diarrhea.  No constipation. Genitourinary: Negative for dysuria. Musculoskeletal: Negative for back pain. Skin: Negative for rash. Neurological: Negative for headaches, focal weakness or numbness. Denies hallucinations. Does report he was feeling suicidal earlier, he also reports feeling very depressed. He does tell me that he no longer wants to kill himself, and he is  agreeable to treatment and seeing psychiatry here.  10-point ROS otherwise negative.  ____________________________________________   PHYSICAL EXAM:  VITAL SIGNS: ED Triage Vitals  Enc Vitals Group     BP 04/12/15 2109 152/62 mmHg     Pulse Rate 04/12/15 2109 89     Resp 04/12/15 2109 18     Temp 04/12/15 2109 98.2 F (36.8 C)     Temp Source 04/12/15 2109 Oral     SpO2 04/12/15 2109 95 %     Weight 04/12/15 2109 125 lb (56.7 kg)     Height 04/12/15 2109 5\' 6"  (1.676 m)     Head Cir --      Peak Flow --      Pain Score 04/12/15 2110 3     Pain Loc --      Pain Edu? --      Excl. in GC? --    Constitutional: Alert and oriented. Well appearing and in no acute distress. Eyes: Conjunctivae are normal. PERRL. EOMI. Head: Atraumatic. Nose: No congestion/rhinnorhea. Mouth/Throat: Mucous membranes are dry.  Oropharynx non-erythematous. Neck: No stridor.   Cardiovascular: Normal rate, regular rhythm. Grossly normal heart sounds.  Good peripheral circulation. Respiratory: Normal respiratory effort.  No retractions. Lungs CTAB. No active wheezing. He is somewhat barrel chested appearing. No distress and speaks comfortably with normal respirations. Gastrointestinal: Soft and nontender. No distention. No abdominal bruits. No CVA tenderness. Musculoskeletal: No lower extremity tenderness nor edema.  No joint effusions. Neurologic:  Normal speech and language. No gross focal neurologic deficits are appreciated. No gait instability. Skin:  Skin is warm, dry and intact. No rash noted. Psychiatric: Mood and affect are flat. He does appear depressed. ____________________________________________   LABS (all labs ordered are listed, but only abnormal results are displayed)  Labs Reviewed  COMPREHENSIVE METABOLIC PANEL - Abnormal; Notable for the following:    Glucose, Bld 112 (*)    Creatinine, Ser 1.32 (*)    GFR calc non Af Amer 51 (*)    GFR calc Af Amer 59 (*)    All other  components within normal limits  ACETAMINOPHEN LEVEL - Abnormal; Notable for the following:    Acetaminophen (Tylenol), Serum <10 (*)    All other components within normal limits  CBC - Abnormal; Notable for the following:    WBC 12.4 (*)    RDW 15.7 (*)    All other components within normal limits  ETHANOL  SALICYLATE LEVEL  URINE DRUG SCREEN, QUALITATIVE ([facility] ONLY)  ____________________________________________  EKG   ____________________________________________  RADIOLOGY   ____________________________________________   PROCEDURES  Procedure(s) performed: None  Critical Care performed: No  ____________________________________________   INITIAL IMPRESSION / ASSESSMENT AND PLAN / ED COURSE  Pertinent labs & imaging results that were available during my care of the patient were reviewed by me and considered in my medical decision making (see chart for details).  Patient transfer evaluation of a potential suicide attempt. There is no evidence of active attempt, or ligature marks on it. Apparently he threatened to hang himself but did not take any action. Psychiatric screening labs appear stable, I will continue on his home medications which were obtained from the nurse.  We will place him under involuntary commitment continue him for psychiatric consultation. ____________________________________________   FINAL CLINICAL IMPRESSION(S) / ED DIAGNOSES  Final diagnoses:  Severe episode of recurrent major depressive disorder, without psychotic features (HCC)  Suicidal ideation      Sharyn Creamer, MD 04/13/15 [phone]

## 2015-04-13 NOTE — ED Notes (Signed)
William LoserLAURA COLLINS POA [phone]

## 2015-04-13 NOTE — BH Assessment (Signed)
Assessment Note  William Wilcox is an 75 y.o. male. William Wilcox states "Them old crazy people in my house called and said that I wanted to kill myself. I met some people who were living in their car and I let them stay in my home and I can't take it no more.   My daughter got killed Wednesday night - shot her in the head. 2 people are in jail tonight for that". He denied using alcohol or drugs. He denied auditory or visual hallucinations. He reports missing his daughter and wanting the people out of his house.  He denied homicidal and suicidal ideation or intent.  He states, I have had enough. He is currently being seen at the [place] in [facility] for healthcare. He is seen for geropsychiatry by Dr. Carleene Cooper at the [facility].  He has a healthcare power of attorney.  She states that William Wilcox has "some dementia", but has been able to maintain his living situation.  She reports that William Wilcox was triggered this evening when he saw the news and the reporters stated that 2 people had been caught and charged with the murder of his daughter.  Diagnosis:   Past Medical History:  Past Medical History  Diagnosis Date  . Hypertension   . COPD (chronic obstructive pulmonary disease) ([place])   . Coronary artery disease     Past Surgical History  Procedure Laterality Date  . Back surgery    . Cardiac surgery      triple bypass  . Hip surgery Left     Family History: No family history on file.  Social History:  reports that he has been smoking Cigarettes.  He has a 114 pack-year smoking history. He does not have any smokeless tobacco history on file. He reports that he does not drink alcohol or use illicit drugs.  Additional Social History:  Alcohol / Drug Use History of alcohol / drug use?: No history of alcohol / drug abuse (Denied)  CIWA: CIWA-Ar BP: (!) 152/62 mmHg Pulse Rate: 89 COWS:    Allergies: No Known Allergies  Home Medications:  (Not in a hospital admission)  OB/GYN Status:  No LMP for male  patient.  General Assessment Data Location of Assessment: [facility] ED TTS Assessment: In system Is this a Tele or Face-to-Face Assessment?: Face-to-Face Is this an Initial Assessment or a Re-assessment for this encounter?: Initial Assessment Marital status: Single Maiden name: n/a Is patient pregnant?: No Pregnancy Status: No Living Arrangements: Non-relatives/Friends Can pt return to current living arrangement?: Yes Admission Status: Involuntary Is patient capable of signing voluntary admission?: Yes Referral Source: Self/Family/Friend Insurance type: Medicare  Medical Screening Exam ([place]) Medical Exam completed: Yes  Crisis Care Plan Living Arrangements: Non-relatives/Friends Legal Guardian: Other: (None) Name of Psychiatrist: Dr Carleene Cooper - [facility] Name of Therapist: None reported  Education Status Is patient currently in school?: No Current Grade: n/a Highest grade of school patient has completed: 8th Name of school: [employer] person: n/a  Risk to self with the past 6 months Suicidal Ideation:  (Denies suicidal ideation, but others report suicidal intent) Has patient been a risk to self within the past 6 months prior to admission? : Yes Suicidal Intent:  (Denied, but others state that he has suicidal intent) Has patient had any suicidal intent within the past 6 months prior to admission? : Yes Is patient at risk for suicide?: Yes Suicidal Plan?:  (To hang himself) Has patient had any  suicidal plan within the past 6 months prior to admission? : Yes Access to Means: Yes Specify Access to Suicidal Means: rope at home What has been your use of drugs/alcohol within the last 12 months?: denied use of alchol or drugs Previous Attempts/Gestures: No How many times?: 0 Other Self Harm Risks: Denied Triggers for Past Attempts: Unknown Intentional Self Injurious Behavior: None Family Suicide History: No Recent stressful life  event(s): Loss (Comment), Trauma (Comment) (Daughter murdered on Wednesday) Persecutory voices/beliefs?: No Depression: Yes Depression Symptoms: Despondent Substance abuse history and/or treatment for substance abuse?: No Suicide prevention information given to non-admitted patients: Not applicable  Risk to Others within the past 6 months Homicidal Ideation: No Does patient have any lifetime risk of violence toward others beyond the six months prior to admission? : No Thoughts of Harm to Others: No Current Homicidal Intent: No Current Homicidal Plan: No Access to Homicidal Means: No Identified Victim: None reported History of harm to others?: No Assessment of Violence: None Noted Violent Behavior Description: denied Does patient have access to weapons?: No Criminal Charges Pending?: No Does patient have a court date: No Is patient on probation?: No  Psychosis Hallucinations: None noted Delusions: None noted  Mental Status Report Appearance/Hygiene: In scrubs, Unremarkable Eye Contact: Fair Motor Activity: Restlessness Speech: Logical/coherent Level of Consciousness: Alert Mood: Irritable Affect: Irritable Anxiety Level: Moderate Judgement: Impaired Orientation: Place, Situation Obsessive Compulsive Thoughts/Behaviors: None  Cognitive Functioning Concentration: Fair Memory: Recent Intact IQ: Average Insight: Fair Impulse Control: Fair Appetite: Fair Sleep: No Change Vegetative Symptoms: None  ADLScreening [facility]) Patient's cognitive ability adequate to safely complete daily activities?: Yes Patient able to express need for assistance with ADLs?: Yes Independently performs ADLs?: Yes (appropriate for developmental age)  Prior Inpatient Therapy Prior Inpatient Therapy: No  Prior Outpatient Therapy Prior Outpatient Therapy: No Does patient have an ACCT team?: No Does patient have Intensive In-House Services?  : No Does patient have [facility]  services? : No Does patient have P4CC services?: No  ADL Screening (condition at time of admission) Patient's cognitive ability adequate to safely complete daily activities?: Yes Patient able to express need for assistance with ADLs?: Yes Independently performs ADLs?: Yes (appropriate for developmental age)       Abuse/Neglect Assessment (Assessment to be complete while patient is alone) Physical Abuse: Denies Verbal Abuse: Denies Sexual Abuse: Denies Exploitation of patient/patient's resources: Denies Self-Neglect: Denies Values / Beliefs Cultural Requests During Hospitalization: None Spiritual Requests During Hospitalization: None   Advance Directives (For Healthcare) Does patient have an advance directive?: No Would patient like information on creating an advanced directive?: No - patient declined information    Additional Information 1:1 In Past 12 Months?: No CIRT Risk: No Elopement Risk: No Does patient have medical clearance?: Yes     Disposition:  Disposition Initial Assessment Completed for this Encounter: Yes Disposition of Patient: Other dispositions (to be seen by psychiatrist)  On Site Evaluation by:   Reviewed with Physician:    Elmer Bales 04/13/2015 1:14 AM

## 2015-04-13 NOTE — ED Notes (Signed)
Snack given.

## 2015-04-14 ENCOUNTER — Inpatient Hospital Stay
Admission: RE | Admit: 2015-04-14 | Discharge: 2015-04-15 | DRG: 885 | Disposition: A | Discharge status: Home / Self Care | Payer: Medicare Other | Source: Intra-hospital | Attending: Psychiatry | Admitting: Psychiatry

## 2015-04-14 DIAGNOSIS — J449 Chronic obstructive pulmonary disease, unspecified: Secondary | ICD-10-CM | POA: Diagnosis present

## 2015-04-14 DIAGNOSIS — Z638 Other specified problems related to primary support group: Secondary | ICD-10-CM

## 2015-04-14 DIAGNOSIS — I10 Essential (primary) hypertension: Secondary | ICD-10-CM | POA: Diagnosis present

## 2015-04-14 DIAGNOSIS — Z79899 Other long term (current) drug therapy: Secondary | ICD-10-CM

## 2015-04-14 DIAGNOSIS — E785 Hyperlipidemia, unspecified: Secondary | ICD-10-CM | POA: Diagnosis present

## 2015-04-14 DIAGNOSIS — Z9889 Other specified postprocedural states: Secondary | ICD-10-CM

## 2015-04-14 DIAGNOSIS — G8929 Other chronic pain: Secondary | ICD-10-CM | POA: Diagnosis present

## 2015-04-14 DIAGNOSIS — R45851 Suicidal ideations: Secondary | ICD-10-CM | POA: Diagnosis present

## 2015-04-14 DIAGNOSIS — I2510 Atherosclerotic heart disease of native coronary artery without angina pectoris: Secondary | ICD-10-CM | POA: Diagnosis present

## 2015-04-14 DIAGNOSIS — F322 Major depressive disorder, single episode, severe without psychotic features: Secondary | ICD-10-CM | POA: Diagnosis present

## 2015-04-14 DIAGNOSIS — F17210 Nicotine dependence, cigarettes, uncomplicated: Secondary | ICD-10-CM | POA: Diagnosis present

## 2015-04-14 DIAGNOSIS — F332 Major depressive disorder, recurrent severe without psychotic features: Secondary | ICD-10-CM | POA: Diagnosis present

## 2015-04-14 DIAGNOSIS — G4700 Insomnia, unspecified: Secondary | ICD-10-CM | POA: Diagnosis present

## 2015-04-14 DIAGNOSIS — F17200 Nicotine dependence, unspecified, uncomplicated: Secondary | ICD-10-CM | POA: Diagnosis present

## 2015-04-14 DIAGNOSIS — M25559 Pain in unspecified hip: Secondary | ICD-10-CM | POA: Diagnosis present

## 2015-04-14 MED ORDER — NICOTINE 21 MG/24HR TD PT24
21.0000 mg | MEDICATED_PATCH | Freq: Every day | TRANSDERMAL | Status: DC
  Administered 2015-04-14 – 2015-04-15 (×2): 21 mg via TRANSDERMAL
  Filled 2015-04-14 (×2): qty 1

## 2015-04-14 MED ORDER — PNEUMOCOCCAL VAC POLYVALENT 25 MCG/0.5ML IJ INJ: 0.5000 mL | INJECTION | INTRAMUSCULAR | Status: DC

## 2015-04-14 MED ORDER — LISINOPRIL 20 MG PO TABS
40.0000 mg | ORAL_TABLET | Freq: Every day | ORAL | Status: DC
  Administered 2015-04-14 – 2015-04-15 (×2): 40 mg via ORAL
  Filled 2015-04-14 (×2): qty 2

## 2015-04-14 MED ORDER — DULOXETINE HCL 30 MG PO CPEP
30.0000 mg | ORAL_CAPSULE | Freq: Every day | ORAL | Status: DC
  Administered 2015-04-14 – 2015-04-15 (×2): 30 mg via ORAL
  Filled 2015-04-14 (×2): qty 1

## 2015-04-14 MED ORDER — ALUM & MAG HYDROXIDE-SIMETH 200-200-20 MG/5ML PO SUSP: 30.0000 mL | ORAL | Status: DC | PRN

## 2015-04-14 MED ORDER — SIMVASTATIN 20 MG PO TABS
80.0000 mg | ORAL_TABLET | Freq: Every day | ORAL | Status: DC
  Administered 2015-04-14: 80 mg via ORAL
  Filled 2015-04-14: qty 4

## 2015-04-14 MED ORDER — GABAPENTIN 100 MG PO CAPS
100.0000 mg | ORAL_CAPSULE | Freq: Three times a day (TID) | ORAL | Status: DC
  Administered 2015-04-14 – 2015-04-15 (×4): 100 mg via ORAL
  Filled 2015-04-14 (×4): qty 1

## 2015-04-14 MED ORDER — LORATADINE 10 MG PO TABS
10.0000 mg | ORAL_TABLET | Freq: Every day | ORAL | Status: DC
  Administered 2015-04-14 – 2015-04-15 (×2): 10 mg via ORAL
  Filled 2015-04-14 (×2): qty 1

## 2015-04-14 MED ORDER — TRAZODONE HCL 100 MG PO TABS
100.0000 mg | ORAL_TABLET | Freq: Every day | ORAL | Status: DC
  Administered 2015-04-14: 100 mg via ORAL
  Filled 2015-04-14: qty 1

## 2015-04-14 MED ORDER — ACETAMINOPHEN 325 MG PO TABS
650.0000 mg | ORAL_TABLET | Freq: Four times a day (QID) | ORAL | Status: DC | PRN
  Administered 2015-04-14: 650 mg via ORAL
  Filled 2015-04-14: qty 2

## 2015-04-14 MED ORDER — ALBUTEROL SULFATE HFA 108 (90 BASE) MCG/ACT IN AERS
2.0000 | INHALATION_SPRAY | Freq: Four times a day (QID) | RESPIRATORY_TRACT | Status: DC | PRN
  Administered 2015-04-15: 2 via RESPIRATORY_TRACT
  Filled 2015-04-14: qty 6.7

## 2015-04-14 MED ORDER — MAGNESIUM HYDROXIDE 400 MG/5ML PO SUSP: 30.0000 mL | Freq: Every day | ORAL | Status: DC | PRN

## 2015-04-14 NOTE — Progress Notes (Signed)
D:  Per pt self inventory pt reports sleeping poor-would like some medication to help with sleep-MD notified, appetite poor, energy level low, ability to pay attention normal, rates depression at a 5 out of 10, hopelessness at a 5 out of 10, denies anxiety, goal today:  "get out of here and talk to the doctor", denies SI/HI/AVH, flat/depressed affect.    A:  Emotional support provided, MD notified that patient is requesting something to help with sleep, Encouraged pt to continue with treatment plan and attend all group activities, q15 min checks maintained for safety.  R:  Pt is pleasant, receptive, states that he has no intention on hurting himself, states that he is just very depressed because his daughter got killed 2 days ago, wife died 2 years ago and granddaughter whom he raised was put up for adoption by his daughter 2 years ago, appropriate during interaction, pleasant and cooperative with staff and other patients on the unit.

## 2015-04-14 NOTE — Tx Team (Signed)
Initial Interdisciplinary Treatment Plan   PATIENT STRESSORS: Loss of daughter. He is unable to find a job.    PATIENT STRENGTHS: Coherent thoughts and logical thinking. He is able to communicate his needs effectively   PROBLEM LIST: Problem List/Patient Goals Date to be addressed Date deferred Reason deferred Estimated date of resolution  Depression 04/14/15   05/02/15  Suicidal Thoughts 04/14/15   05/02/15                                             DISCHARGE CRITERIA:  Improved stabilization in mood, thinking, and/or behavior  PRELIMINARY DISCHARGE PLAN: Return to previous living arrangement  PATIENT/FAMIILY INVOLVEMENT: This treatment plan has been presented to and reviewed with the patient, Jerrel IvorySamuel C Jeppsen.  The patient and family have been given the opportunity to ask questions and make suggestions.  Lexa Coronado A Danner Paulding 04/14/2015, 3:46 AM

## 2015-04-14 NOTE — [facility] Suicide Risk Assessment (Signed)
[facility]   Nursing information obtained from:    Demographic factors:    Current Mental Status:    Loss Factors:    Historical Factors:    Risk Reduction Factors:    Total Time spent with patient: 1 hour Principal Problem: Major depressive disorder, single episode, severe without psychotic features (HCC) Diagnosis:   Patient Active Problem List   Diagnosis Date Noted  . Dyslipidemia [E78.5] 04/14/2015  . Tobacco use disorder [F17.200] 04/14/2015  . Major depressive disorder, recurrent severe without psychotic features (HCC) [F33.2] 04/14/2015  . Major depressive disorder, single episode, severe without psychotic features (HCC) [F32.2] 04/13/2015  . COPD (chronic obstructive pulmonary disease) (HCC) [J44.9] 04/13/2015  . 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 Hypertension [I10] 04/13/2015     Continued Clinical Symptoms:  Alcohol Use Disorder Identification Test Final Score (AUDIT): 0 The "Alcohol Use Disorders Identification Test", Guidelines for Use in Primary Care, Second Edition.  World Science writerHealth Organization [facility](WHO). Score between 0-7:  no or low risk or alcohol related problems. Score between 8-15:  moderate risk of alcohol related problems. Score between 16-19:  high risk of alcohol related problems. Score 20 or above:  warrants further diagnostic evaluation for alcohol dependence and treatment.   CLINICAL FACTORS:   Depression:   Severe   Musculoskeletal: Strength & Muscle Tone: within normal limits Gait & Station: normal Patient leans: N/A  Psychiatric Specialty Exam: I reviewed physical exam performed in the emergency room and agree with the findings. Physical Exam  Nursing note and vitals reviewed.   Review of Systems  Musculoskeletal: Positive for joint pain.  Skin: Positive for rash.  All other systems reviewed and are negative.   Blood pressure 143/71, pulse 82, temperature 97.8 F (36.6 C), temperature source Oral,  resp. rate 16, height 5\' 5"  (1.651 m), weight 62.143 kg (137 lb), SpO2 95 %.Body mass index is 22.8 kg/(m^2).  General Appearance: Casual  Eye Contact::  Good  Speech:  Clear and Coherent  Volume:  Normal  Mood:  Depressed, Hopeless and Worthless  Affect:  Blunt  Thought Process:  Goal Directed  Orientation:  Full (Time, Place, and Person)  Thought Content:  WDL  Suicidal Thoughts:  Yes.  with intent/plan  Homicidal Thoughts:  No  Memory:  Immediate;   Fair Recent;   Fair Remote;   Fair  Judgement:  Impaired  Insight:  Shallow  Psychomotor Activity:  Normal  Concentration:  Fair  Recall:  FiservFair  Fund of Knowledge:Fair  Language: Fair  Akathisia:  No  Handed:  Right  AIMS (if indicated):     Assets:  Communication Skills Desire for Improvement Financial Resources/Insurance Housing Resilience  Sleep:  Number of Hours: 2  Cognition: WNL  ADL's:  Intact     COGNITIVE FEATURES THAT CONTRIBUTE TO RISK:  None    SUICIDE RISK:   Moderate:  Frequent suicidal ideation with limited intensity, and duration, some specificity in terms of plans, no associated intent, good self-control, limited dysphoria/symptomatology, some risk factors present, and identifiable protective factors, including available and accessible social support.  PLAN OF CARE: Hospital admission, medication management, discharge planning.  Medical Decision Making:  New problem, with additional work up planned, Review of Psycho-Social Stressors (1), Review or order clinical lab tests (1), Review of Medication Regimen & Side Effects (2) and Review of New Medication or Change in Dosage (2)   William Wilcox is a 75 year old male with a history of depression admitted after a  suicide attempt by hanging following his daughter's death by shooting 2 days prior.  1. Suicidal ideation. The patient is able to contract for safety.  2. Mood. We will continue Cymbalta.  3. Hypertension. We'll continue lisinopril.  4.  Dyslipidemia. We'll continue  Zocor.  5. COPD. We'll continue inhalers.  6. Smoking. Nicotine patch is available.  7. Insomnia. We offered trazodone but the patient slept 2 hours only.  8. Disposition. He will be discharged to home. He will follow-up with a new psychiatrist.      I certify that inpatient services furnished can reasonably be expected to improve the patient's condition.   Everly Rubalcava 04/14/2015, 10:23 AM

## 2015-04-14 NOTE — B[facility] Group Notes (Signed)
B[facility] LCSW Aftercare Discharge Planning Group Note  04/14/2015 9:15 AM  Participation Quality: Did Not Attend. Patient invited to participate but declined.   William Wilcox F. Ewelina Naves, MSW, LCSWA, LCAS   

## 2015-04-14 NOTE — Plan of Care (Signed)
Problem: Alteration in mood Goal: LTG-Patient reports reduction in suicidal thoughts (Patient reports reduction in suicidal thoughts and is able to verbalize a safety plan for whenever patient is feeling suicidal)  Outcome: Progressing Pt denies SI/HI/AVH, verbal contract to report SI, remains safe.

## 2015-04-14 NOTE — BHH Group Notes (Signed)
B[facility] Group Notes:  (Nursing/MHT/Case Management/Adjunct)  Date:  04/14/2015  Time:  12:14 PM  Type of Therapy:  Psychoeducational Skills  Participation Level:  Did Not Attend    [name] 04/14/2015, 12:14 PM

## 2015-04-14 NOTE — Progress Notes (Signed)
Initial Nutrition Assessment     INTERVENTION:  Meals and snacks: Cater to pt preferences Medical Nutrition Supplement Therapy: Will add mightyshake BID for added nutrition   NUTRITION DIAGNOSIS:   Inadequate oral intake related to acute illness as evidenced by per patient/family report.    GOAL:   Patient will meet greater than or equal to 90% of their needs    MONITOR:    (Energy intake)  REASON FOR ASSESSMENT:   Malnutrition Screening Tool    ASSESSMENT:      Pt admitted with major depression  Past Medical History  Diagnosis Date  . Hypertension   . COPD (chronic obstructive pulmonary disease) (HCC)   . Coronary artery disease     Current Nutrition: ate 90% of breakfast this am  Food/Nutrition-Related History: decreased intake prior to admission. Dtr shot prior to admission and killed.     Scheduled Medications:  . DULoxetine  30 mg Oral Daily  . gabapentin  100 mg Oral TID  . lisinopril  40 mg Oral Daily  . loratadine  10 mg Oral Daily  . nicotine  21 mg Transdermal Daily  . [START ON 04/15/2015] pneumococcal 23 valent vaccine  0.5 mL Intramuscular Tomorrow-1000  . simvastatin  80 mg Oral q1800  . traZODone  100 mg Oral QHS       Electrolyte/Renal Profile and Glucose Profile:   Recent Labs Lab 04/12/15 2121  NA 138  K 4.3  CL 105  CO2 26  BUN 13  CREATININE 1.32*  CALCIUM 9.2  GLUCOSE 112*   Protein Profile:  Recent Labs Lab 04/12/15 2121  ALBUMIN 4.2    Gastrointestinal Profile: Last BM: wDL   Nutrition-Focused Physical Exam Findings:  Unable to complete Nutrition-Focused physical exam at this time.     Weight Change: reports wt loss but unsure how much    Diet Order:  Diet Heart Room service appropriate?: Yes; Fluid consistency:: Thin  Skin:   reviewed     Height:   Ht Readings from Last 1 Encounters:  04/14/15 5\' 5"  (1.651 m)    Weight:   Wt Readings from Last 1 Encounters:  04/14/15 137 lb (62.143 kg)     Ideal Body Weight:     BMI:  Body mass index is 22.8 kg/(m^2).   EDUCATION NEEDS:   No education needs identified at this time  LOW Care Level  Castle Lamons B. Freida BusmanAllen, RD, LDN [phone] (pager) Weekend/On-Call pager [phone])

## 2015-04-14 NOTE — [facility] Group Notes (Signed)
[facility] Group Notes:  (Nursing/MHT/Case Management/Adjunct)  Date:  04/14/2015  Time:  10:28 PM  Type of Therapy:  Group Therapy  Participation Level:  Active  Participation Quality:  Appropriate, Attentive and Sharing  Affect:  Appropriate  Cognitive:  Appropriate  Insight:  Appropriate  Engagement in Group:  Engaged  Modes of Intervention:  Support  Summary of Progress/Problems:  William ReddenOlivia Briana Wilcox 04/14/2015, 10:28 PM
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
## 2015-04-14 NOTE — Plan of Care (Signed)
Problem: Consults Goal: Depression Patient Education See Patient Education Module for education specifics. Outcome: Not Progressing Patient is still unable to cope with the death of his daughter.
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
## 2015-04-14 NOTE — [facility] Group Notes (Signed)
[facility] LCSW Group Therapy   04/14/2015 1 pm  Type of Therapy: Group Therapy   Participation Level: Did Not Attend. Patient invited to participate but declined.    Lovelyn Sheeran F. Lorenia Hoston, MSW, LCSWA, LCAS   

## 2015-04-15 NOTE — [facility] Suicide Risk Assessment (Signed)
[facility]   Demographic Factors:  Male, Age 75 or older, Divorced or widowed, Caucasian and Living alone  Total Time spent with patient: 30 minutes  Musculoskeletal: Strength & Muscle Tone: within normal limits Gait & Station: normal Patient leans: N/A  Psychiatric Specialty Exam: Physical Exam  Nursing note and vitals reviewed.   Review of Systems  Musculoskeletal: Positive for joint pain.    Blood pressure 132/51, pulse 67, temperature 97.9 F (36.6 C), temperature source Oral, resp. rate 24, height 5\' 5"  (1.651 m), weight 62.143 kg (137 lb), SpO2 96 %.Body mass index is 22.8 kg/(m^2).  General Appearance: Casual  Eye Contact::  Good  Speech:  Clear and Coherent409  Volume:  Normal  Mood:  Anxious  Affect:  Appropriate  Thought Process:  Goal Directed  Orientation:  Full (Time, Place, and Person)  Thought Content:  WDL  Suicidal Thoughts:  No  Homicidal Thoughts:  No  Memory:  Immediate;   Fair Recent;   Fair Remote;   Fair  Judgement:  Fair  Insight:  Fair  Psychomotor Activity:  Normal  Concentration:  Fair  Recall:  Fis[employer]Fair  Fund of Knowledge:Fair  Language: Fair  Akathisia:  No  Handed:  Right  AIMS (if indicated):     Assets:  Communication Skills Desire for Improvement Financial Resources/Insurance Housing Resilience Social Support  Sleep:  Number of Hours: 7.25  Cognition: WNL  ADL's:  Intact   Have you used any form of tobacco in the last 30 days? (Cigarettes, Smokeless Tobacco, Cigars, and/or Pipes): Yes  Has this patient used any form of tobacco in the last 30 days? (Cigarettes, Smokeless Tobacco, Cigars, and/or Pipes) Yes, A prescription for an FDA-approved tobacco cessation medication was offered at discharge and the patient refused  Mental Status Per Nursing Assessment::   On Admission:     Current Mental Status by Physician: NA  Loss Factors: Loss of significant relationship  Historical Factors: Anniversary  of important loss  Risk Reduction Factors:   Sense of responsibility to family and Positive social support  Continued Clinical Symptoms:  Depression:   Severe  Cognitive Features That Contribute To Risk:  None    Suicide Risk:  Minimal: No identifiable suicidal ideation.  Patients presenting with no risk factors but with morbid ruminations; may be classified as minimal risk based on the severity of the depressive symptoms  Principal Problem: Major depressive disorder, single episode, severe without psychotic features [facility](HCC) Discharge Diagnoses:  Patient Active Problem List   Diagnosis Date Noted  . Dyslipidemia [E78.5] 04/14/2015  . Tobacco use disorder [F17.200] 04/14/2015  . Major depressive disorder, recurrent severe without psychotic features (HCC) [F33.2] 04/14/2015  . Major depressive disorder, single episode, severe without psychotic features (HCC) [F32.2] 04/13/2015  . COPD (chronic obstructive pulmonary disease) (HCC) [J44.9] 04/13/2015  . 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 Hypertension [I10] 04/13/2015      Plan Of Care/Follow-up recommendations:  Activity:  As tolerated. Diet:  Low sodium heart healthy. Other:  Keep follow-up appointments.  Is patient on multiple antipsychotic therapies at discharge:  No   Has Patient had three or more failed trials of antipsychotic monotherapy by history:  No  Recommended Plan for Multiple Antipsychotic Therapies: NA    Sirius Woodford 04/15/2015, 11:06 AM

## 2015-04-15 NOTE — [facility] Group Notes (Signed)
[facility] Group Notes:  (Nursing/MHT/Case Management/Adjunct)  Date:  04/15/2015  Time:  2:19 PM  Type of Therapy:  Psychoeducational Skills  Participation Level:  Did Not Attend   Lynelle SmokeCara Travis [facility] IncMadoni 04/15/2015, 2:19 PM

## 2015-04-15 NOTE — Progress Notes (Signed)
Patient discharge at this time to friend's personal car. Acknowledges all belongings are returned. Verbalizes understanding rt recommended plan of care. No distress, no complaint. Safety maintained.

## 2015-04-15 NOTE — Progress Notes (Addendum)
Patient with depressed affect, cooperative behavior with meals, meds and plan of care. Denies SI/HI at this time. States "he is motivated to live for his 76 year old grandaughter that he does get to visit with". Fair adls. Motivated to discharge to plan daughter's funeral who passed away recently. Patient to discharge today when discharge plan and transportation in place. Ambulates with slow and steady gait with walker. Safety maintained. Completes self audit sheet with goal of discharging today.

## 2015-04-15 NOTE — Progress Notes (Signed)
Patient denies SI/HI/AVH. Patient endorses depression and sadness. Patient states that he has found something to live for in his grand daughter. The patient states that he believes he will be ok when he is discharged, but it will take some time to cope with the loss of his daughter. Emotional support and encouragement provided to patient.   Patient had complaints of SOB at 0140. Albuterol and Oxygen 2 L given with good result. Patient currently resting with staff member at bedside for safety. Will continue Monitor. MD made aware.
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
## 2016-05-14 ENCOUNTER — Emergency Department
Admission: EM | Admit: 2016-05-14 | Discharge: 2016-05-15 | Disposition: A | Discharge status: Home / Self Care | Payer: Medicare Other | Attending: Emergency Medicine | Admitting: Emergency Medicine

## 2016-05-14 ENCOUNTER — Encounter: Payer: Self-pay | Admitting: Emergency Medicine

## 2016-05-14 ENCOUNTER — Emergency Department: Payer: Medicare Other

## 2016-05-14 DIAGNOSIS — J449 Chronic obstructive pulmonary disease, unspecified: Secondary | ICD-10-CM | POA: Insufficient documentation

## 2016-05-14 DIAGNOSIS — M79605 Pain in left leg: Secondary | ICD-10-CM | POA: Diagnosis present

## 2016-05-14 DIAGNOSIS — F17210 Nicotine dependence, cigarettes, uncomplicated: Secondary | ICD-10-CM | POA: Diagnosis not present

## 2016-05-14 DIAGNOSIS — Z79899 Other long term (current) drug therapy: Secondary | ICD-10-CM | POA: Insufficient documentation

## 2016-05-14 DIAGNOSIS — I10 Essential (primary) hypertension: Secondary | ICD-10-CM | POA: Insufficient documentation

## 2016-05-14 DIAGNOSIS — I2510 Atherosclerotic heart disease of native coronary artery without angina pectoris: Secondary | ICD-10-CM | POA: Insufficient documentation

## 2016-05-14 NOTE — ED Notes (Signed)
Report from emma, rn.  

## 2016-05-14 NOTE — ED Provider Notes (Signed)
[facility] Emergency Department Provider Note  ____________________________________________  Time seen: Approximately 11:16 PM  I have reviewed the triage vital signs and the nursing notes.   HISTORY  Chief Complaint Leg Pain    HPI William Wilcox is a 76 y.o. male who presents to emergency department via EMS for complaint of left leg pain. Patient states that he had pain that began this evening pain described as sharp. He reports that pain was in his calf. He does have a history of varicose veins. Patient reports that initially pain was sharp. He reports that currently he is completely symptom-free. He denies any headache, chest pain, shortness of breath, abdominal pain, nausea or vomiting. Patient denies any trauma to his back or bilateral lower extremities. He denies any recent surgery, cancer treatment, immobilization.   Past Medical History:  Diagnosis Date  . COPD (chronic obstructive pulmonary disease) (HCC)   . Coronary artery disease   . Hypertension     Patient Active Problem List   Diagnosis Date Noted  . Dyslipidemia 04/14/2015  . Tobacco use disorder 04/14/2015  . 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 Major depressive disorder, single episode, severe without psychotic features (HCC) 04/13/2015  . COPD (chronic obstructive pulmonary disease) (HCC) 04/13/2015  . Chronic hip pain 04/13/2015  . Hypertension 04/13/2015    Past Surgical History:  Procedure Laterality Date  . BACK SURGERY    . CARDIAC SURGERY     triple bypass  . HIP SURGERY Left     Prior to Admission medications   Medication Sig Start Date End Date Taking? Authorizing Provider  acetaminophen (TYLENOL) 325 MG tablet Take 650 mg by mouth.    Historical Provider, MD  albuterol (PROVENTIL HFA;VENTOLIN HFA) 108 (90 Base) MCG/ACT inhaler Inhale into the lungs.    Historical Provider, MD  ammonium lactate (LAC-HYDRIN) 12 %  lotion Apply 1 application topically as needed for dry skin.    Historical Provider, MD  doxycycline (VIBRAMYCIN) 100 MG capsule Take 100 mg by mouth.    Historical Provider, MD  DULoxetine (CYMBALTA) 30 MG capsule Take 30 mg by mouth daily.    Historical Provider, MD  gabapentin (NEURONTIN) 100 MG capsule Take 100 mg by mouth.    Historical Provider, MD  guaiFENesin-dextromethorphan (ROBITUSSIN DM) 100-10 MG/5ML syrup Take 5 mLs by mouth.    Historical Provider, MD  ipratropium-albuterol (DUONEB) 0.5-2.5 (3) MG/3ML SOLN Take 3 mLs by nebulization every 4 (four) hours as needed.    Historical Provider, MD  lisinopril (PRINIVIL,ZESTRIL) 40 MG tablet Take 40 mg by mouth daily.    Historical Provider, MD  loratadine (CLARITIN) 10 MG tablet Take 10 mg by mouth daily.    Historical Provider, MD  predniSONE (DELTASONE) 20 MG tablet Take 20 mg by mouth.    Historical Provider, MD  simvastatin (ZOCOR) 80 MG tablet Take 80 mg by mouth every evening.    Historical Provider, MD  triamcinolone cream (KENALOG) 0.1 % Apply 1 application topically.    Historical Provider, MD    Allergies Patient has no known allergies.  No family history on file.  Social History Social History  Substance Use Topics  . Smoking status: Current Every Day Smoker    Packs/day: 2.00    Years: 57.00    Types: Cigarettes  . Smokeless tobacco: Never Used  . Alcohol use No     Review of Systems  Constitutional: No fever/chills Cardiovascular: no chest pain. Respiratory: no cough.  No SOB. Musculoskeletal: Positive for left leg pain that has resolved Skin: Negative for rash, abrasions, lacerations, ecchymosis. Neurological: Negative for headaches, focal weakness or numbness. 10-point ROS otherwise negative.  ____________________________________________   PHYSICAL EXAM:  VITAL SIGNS: ED Triage Vitals [05/14/16 2042]  Enc Vitals Group     BP (!) 157/75     Pulse Rate 95     Resp 18     Temp 98.9 F (37.2 C)      Temp Source Oral     SpO2 98 %     Weight 130 lb (59 kg)     Height 5\' 5"  (1.651 m)     Head Circumference      Peak Flow      Pain Score 10     Pain Loc      Pain Edu?      Excl. in GC?      Constitutional: Alert and oriented. Well appearing and in no acute distress. Eyes: Conjunctivae are normal. PERRL. EOMI. Head: Atraumatic. Neck: No stridor.    Cardiovascular: Normal rate, regular rhythm. Normal S1 and S2.  Good peripheral circulation. Respiratory: Normal respiratory effort without tachypnea or retractions. Lungs CTAB. Good air entry to the bases with no decreased or absent breath sounds. Musculoskeletal: Full range of motion to all extremities. No gross deformities appreciated. No deformities or edema noted to left lower extremity on inspection. Full range of motion to the left hip, knee, ankle. Patient is nontender to palpation over the osseous and muscular structures of the lower extremity. No tenderness to palpation over the lumbar spine. Dorsalis pedis pulse intact bilateral lower extremities. Sensation intact and equal bilateral lower extremities. Neurologic:  Normal speech and language. No gross focal neurologic deficits are appreciated.  Skin:  Skin is warm, dry and intact. No rash noted. Psychiatric: Mood and affect are normal. Speech and behavior are normal. Patient exhibits appropriate insight and judgement.   ____________________________________________   LABS (all labs ordered are listed, but only abnormal results are displayed)  Labs Reviewed - No data to display ____________________________________________  EKG   ____________________________________________  RADIOLOGY Festus BarrenI, Samnang Shugars D Dalphine Cowie, personally viewed and evaluated these images (plain radiographs) as part of my medical decision making, as well as reviewing the written report by the radiologist.  [place] Venous Img Lower Bilateral  Result Date: 05/14/2016 CLINICAL DATA:  Initial evaluation for  bilateral leg pain, left greater than right. EXAM: BILATERAL LOWER EXTREMITY VENOUS DOPPLER ULTRASOUND TECHNIQUE: Gray-scale sonography with graded compression, as well as color Doppler and duplex ultrasound were performed to evaluate the lower extremity deep venous systems from the level of the common femoral vein and including the common femoral, femoral, profunda femoral, popliteal and calf veins including the posterior tibial, peroneal and gastrocnemius veins when visible. The superficial great saphenous vein was also interrogated. Spectral Doppler was utilized to evaluate flow at rest and with distal augmentation maneuvers in the common femoral, femoral and popliteal veins. COMPARISON:  None. FINDINGS: RIGHT LOWER EXTREMITY Common Femoral Vein: No evidence of thrombus. Normal compressibility, respiratory phasicity and response to augmentation. Saphenofemoral Junction: No evidence of thrombus. Normal compressibility and flow on color Doppler imaging. Profunda Femoral Vein: No evidence of thrombus. Normal compressibility and flow on color Doppler imaging. Femoral Vein: No evidence of thrombus. Normal compressibility, respiratory phasicity and response to augmentation. Popliteal Vein: No evidence of thrombus. Normal compressibility, respiratory phasicity and response to augmentation. Calf Veins: No evidence of thrombus. Normal compressibility and flow on color Doppler imaging.  Superficial Great Saphenous Vein: No evidence of thrombus. Normal compressibility and flow on color Doppler imaging. Venous Reflux:  None. Other Findings:  None. LEFT LOWER EXTREMITY Common Femoral Vein: No evidence of thrombus. Normal compressibility, respiratory phasicity and response to augmentation. Saphenofemoral Junction: No evidence of thrombus. Normal compressibility and flow on color Doppler imaging. Profunda Femoral Vein: No evidence of thrombus. Normal compressibility and flow on color Doppler imaging. Femoral Vein: No evidence of  thrombus. Normal compressibility, respiratory phasicity and response to augmentation. Popliteal Vein: No evidence of thrombus. Normal compressibility, respiratory phasicity and response to augmentation. Calf Veins: No evidence of thrombus. Normal compressibility and flow on color Doppler imaging. Superficial Great Saphenous Vein: No evidence of thrombus. Normal compressibility and flow on color Doppler imaging. Venous Reflux:  None. Other Findings:  None. IMPRESSION: No evidence of deep venous thrombosis in either lower extremity. Electronically Signed   By: Rise Mu M.D.   On: 05/14/2016 23:05    ____________________________________________    PROCEDURES  Procedure(s) performed:    Procedures    Medications - No data to display   ____________________________________________   INITIAL IMPRESSION / ASSESSMENT AND PLAN / ED COURSE  Pertinent labs & imaging results that were available during my care of the patient were reviewed by me and considered in my medical decision making (see chart for details).  Review of the [place] CSRS was performed in accordance of the [facility] prior to dispensing any controlled drugs.     Patient's diagnosis is consistent with left leg pain. Patient presented to the emergency department complaining of left lower leg pain, at time of interview, patient was completely symptom free. Patient had a negative ultrasound for DVT. There are no other concerning signs or symptoms and no concerning physical exam findings. At this time, patient is completely symptom-free in no medications will be prescribed. He will follow up with the [facility] as needed.. Patient is given ED precautions to return to the ED for any worsening or new symptoms.     ____________________________________________  FINAL CLINICAL IMPRESSION(S) / ED DIAGNOSES  Final diagnoses:  Left leg pain      NEW MEDICATIONS STARTED DURING THIS VISIT:  New Prescriptions   No medications on  file        This chart was dictated using voice recognition software/Dragon. Despite best efforts to proofread, errors can occur which can change the meaning. Any change was purely unintentional.    Racheal Patches, PA-C 05/14/16 2320    Rockne Menghini, MD 05/19/16 2224

## 2016-05-14 NOTE — ED Notes (Signed)
Spoke with laura collins pt's heath care power of attorney. She is coming to get pt.

## 2016-05-14 NOTE — ED Notes (Signed)
Pt ambulatory around treatment room waiting for a ride without difficulty.

## 2016-05-14 NOTE — ED Triage Notes (Signed)
Pt presents to ED via EMS from home with bilateral leg cramping for the past several hours. Pt states this is the worst his leg pain has ever been and that his varicose veins are "throbbing and the pain is unreal". Pt states his pain was making it diffiucult for him to walk.

## 2016-05-14 NOTE — ED Notes (Signed)
To stat desk via wheelchair by EMS for complaint of pain from hip down leg, described as burning sensation.  BP 180/100, EMS reports patient is non compliant with blood pressure medications.

## 2016-05-15 NOTE — ED Notes (Signed)
Pt wheeled in wheelchair to car with cane in care of laura collins, pt's stated health care power of attorney. Discharge instructions provided to ms. Collins with pt permission.

## 2016-07-11 ENCOUNTER — Encounter: Payer: Self-pay | Admitting: Emergency Medicine

## 2016-07-11 ENCOUNTER — Emergency Department
Admission: EM | Admit: 2016-07-11 | Discharge: 2016-07-11 | Disposition: A | Discharge status: Home / Self Care | Payer: Medicare Other | Attending: Emergency Medicine | Admitting: Emergency Medicine

## 2016-07-11 DIAGNOSIS — S61217A Laceration without foreign body of left little finger without damage to nail, initial encounter: Secondary | ICD-10-CM | POA: Insufficient documentation

## 2016-07-11 DIAGNOSIS — Y92009 Unspecified place in unspecified non-institutional (private) residence as the place of occurrence of the external cause: Secondary | ICD-10-CM | POA: Diagnosis not present

## 2016-07-11 DIAGNOSIS — Y9389 Activity, other specified: Secondary | ICD-10-CM | POA: Diagnosis not present

## 2016-07-11 DIAGNOSIS — I10 Essential (primary) hypertension: Secondary | ICD-10-CM | POA: Insufficient documentation

## 2016-07-11 DIAGNOSIS — Y999 Unspecified external cause status: Secondary | ICD-10-CM | POA: Diagnosis not present

## 2016-07-11 DIAGNOSIS — F17210 Nicotine dependence, cigarettes, uncomplicated: Secondary | ICD-10-CM | POA: Insufficient documentation

## 2016-07-11 DIAGNOSIS — W230XXA Caught, crushed, jammed, or pinched between moving objects, initial encounter: Secondary | ICD-10-CM | POA: Diagnosis not present

## 2016-07-11 DIAGNOSIS — I2510 Atherosclerotic heart disease of native coronary artery without angina pectoris: Secondary | ICD-10-CM | POA: Diagnosis not present

## 2016-07-11 DIAGNOSIS — J449 Chronic obstructive pulmonary disease, unspecified: Secondary | ICD-10-CM | POA: Diagnosis not present

## 2016-07-11 MED ORDER — BACITRACIN ZINC 500 UNIT/GM EX OINT: TOPICAL_OINTMENT | CUTANEOUS | 0 refills | Status: AC

## 2016-07-11 MED ORDER — BACITRACIN ZINC 500 UNIT/GM EX OINT
TOPICAL_OINTMENT | CUTANEOUS | Status: AC
  Filled 2016-07-11: qty 1.8

## 2016-07-11 MED ORDER — BACITRACIN ZINC 500 UNIT/GM EX OINT
TOPICAL_OINTMENT | Freq: Two times a day (BID) | CUTANEOUS | Status: DC
  Administered 2016-07-11: 18:00:00 via TOPICAL

## 2016-07-11 NOTE — ED Notes (Signed)
PA aware of pt's BP, pt instructed to take his BP medication when he got home.
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
## 2016-07-11 NOTE — ED Notes (Signed)
NAD noted at time of D/C. Pt denies questions or concerns. Pt ambulatory to the lobby at this time.  

## 2016-07-11 NOTE — ED Provider Notes (Signed)
[facility] Emergency Department Provider Note  ____________________________________________  Time seen: Approximately 6:04 PM  I have reviewed the triage vital signs and the nursing notes.   HISTORY  Chief Complaint Laceration    HPI William Wilcox is a 76 y.o. male presenting to the emergency department with a superficial 0.5 cm by 0.5 cm laceration of the skin overlying the left fifth digit. Patient states that he accidentally closed a door in his home on his left 5th digit. Patient states that after the incident he "pulled the skin off". Patient states that he has been able to move the finger without pain. Patient is currently retired. No alleviating measures have been attempted besides the application of a clean dressing. Patient's tetanus status is up-to-date.   Past Medical History:  Diagnosis Date  . COPD (chronic obstructive pulmonary disease) (HCC)   . Coronary artery disease   . Hypertension     Patient Active Problem List   Diagnosis Date Noted  . Dyslipidemia 04/14/2015  . Tobacco use disorder 04/14/2015  . 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 Major depressive disorder, single episode, severe without psychotic features (HCC) 04/13/2015  . COPD (chronic obstructive pulmonary disease) (HCC) 04/13/2015  . Chronic hip pain 04/13/2015  . Hypertension 04/13/2015    Past Surgical History:  Procedure Laterality Date  . BACK SURGERY    . CARDIAC SURGERY     triple bypass  . HIP SURGERY Left     Prior to Admission medications   Medication Sig Start Date End Date Taking? Authorizing Provider  acetaminophen (TYLENOL) 325 MG tablet Take 650 mg by mouth.    Historical Provider, MD  albuterol (PROVENTIL HFA;VENTOLIN HFA) 108 (90 Base) MCG/ACT inhaler Inhale into the lungs.    Historical Provider, MD  ammonium lactate (LAC-HYDRIN) 12 % lotion Apply 1 application topically as needed for dry skin.     Historical Provider, MD  bacitracin ointment Apply to affected area daily 07/11/16 07/11/17  Orvil Feil, PA-C  doxycycline (VIBRAMYCIN) 100 MG capsule Take 100 mg by mouth.    Historical Provider, MD  DULoxetine (CYMBALTA) 30 MG capsule Take 30 mg by mouth daily.    Historical Provider, MD  gabapentin (NEURONTIN) 100 MG capsule Take 100 mg by mouth.    Historical Provider, MD  guaiFENesin-dextromethorphan (ROBITUSSIN DM) 100-10 MG/5ML syrup Take 5 mLs by mouth.    Historical Provider, MD  ipratropium-albuterol (DUONEB) 0.5-2.5 (3) MG/3ML SOLN Take 3 mLs by nebulization every 4 (four) hours as needed.    Historical Provider, MD  lisinopril (PRINIVIL,ZESTRIL) 40 MG tablet Take 40 mg by mouth daily.    Historical Provider, MD  loratadine (CLARITIN) 10 MG tablet Take 10 mg by mouth daily.    Historical Provider, MD  predniSONE (DELTASONE) 20 MG tablet Take 20 mg by mouth.    Historical Provider, MD  simvastatin (ZOCOR) 80 MG tablet Take 80 mg by mouth every evening.    Historical Provider, MD  triamcinolone cream (KENALOG) 0.1 % Apply 1 application topically.    Historical Provider, MD    Allergies Patient has no known allergies.  No family history on file.  Social History Social History  Substance Use Topics  . Smoking status: Current Every Day Smoker    Packs/day: 2.00    Years: 57.00    Types: Cigarettes  . Smokeless tobacco: Never Used  . Alcohol use No   Review of Systems  Constitutional: No fever/chills Eyes: No  visual changes. No discharge ENT: No upper respiratory complaints. Cardiovascular: no chest pain. Respiratory: no cough. No SOB. Gastrointestinal: No abdominal pain. No nausea, no vomiting.  No diarrhea.  No constipation. Musculoskeletal: Negative for musculoskeletal pain. Skin: Patient has laceration of the skin overlying the left fifth digit.  Neurological: Negative for headaches, focal weakness or  numbness. ____________________________________________   PHYSICAL EXAM:  VITAL SIGNS: ED Triage Vitals  Enc Vitals Group     BP 07/11/16 1646 (!) 167/73     Pulse Rate 07/11/16 1646 88     Resp 07/11/16 1646 16     Temp 07/11/16 1646 98.4 F (36.9 C)     Temp Source 07/11/16 1646 Oral     SpO2 07/11/16 1646 95 %     Weight 07/11/16 1643 130 lb (59 kg)     Height 07/11/16 1643  (1.651 m)     Head Circumference --      Peak Flow --      Pain Score 07/11/16 1640 2     Pain Loc --      Pain Edu? --      Excl. in GC? --      Constitutional: Alert and oriented. Well appearing and in no acute distress. Eyes: Conjunctivae are normal. PERRL. EOMI. Head: Atraumatic. Cardiovascular: Normal rate, regular rhythm. Normal S1 and S2.  Good peripheral circulation. Respiratory: Normal respiratory effort without tachypnea or retractions. Lungs CTAB. Good air entry to the bases with no decreased or absent breath sounds. Musculoskeletal: Full range of motion to all extremities. No gross deformities appreciated. Neurologic:  Normal speech and language. No gross focal neurologic deficits are appreciated.  Skin:  Patient has a 0.5 cm x 0.5 cm superficial laceration with skin avulsion.  Psychiatric: Mood and affect are normal. Speech and behavior are normal. Patient exhibits appropriate insight and judgement.   ____________________________________________   LABS (all labs ordered are listed, but only abnormal results are displayed)  Labs Reviewed - No data to display ____________________________________________  EKG   ____________________________________________  RADIOLOGY   No results found.  ____________________________________________    PROCEDURES  Procedure(s) performed:    Procedures    Medications  bacitracin ointment (not administered)  bacitracin 500 UNIT/GM ointment (not administered)     ____________________________________________   INITIAL  IMPRESSION / ASSESSMENT AND PLAN / ED COURSE  Pertinent labs & imaging results that were available during my care of the patient were reviewed by me and considered in my medical decision making (see chart for details).  Review of the [place] CSRS was performed in accordance of the [facility] prior to dispensing any controlled drugs.     Assessment and plan: Left fifth digit laceration:  Patient presents to the emergency department with a 0.5 cm x 0.5 cm superficial laceration with skin avulsion. Patient's laceration is not suitable for repair. Bacitracin ointment was applied in the emergency department. Patient was discharged with bacitracin ointment. Patient education was provided regarding wound care. Patient was advised to follow up with primary care in 1 week. All patient questions were answered. ____________________________________________  FINAL CLINICAL IMPRESSION(S) / ED DIAGNOSES  Final diagnoses:  Laceration of left little finger without foreign body without damage to nail, initial encounter      NEW MEDICATIONS STARTED DURING THIS VISIT:  New Prescriptions   BACITRACIN OINTMENT    Apply to affected area daily        This chart was dictated using voice recognition software/Dragon. Despite best efforts to proofread, errors  can occur which can change the meaning. Any change was purely unintentional.    Orvil Feil, PA-C 07/11/16 1814    Merrily Brittle, MD 07/11/16 2119

## 2016-09-27 ENCOUNTER — Encounter (INDEPENDENT_AMBULATORY_CARE_PROVIDER_SITE_OTHER): Payer: Medicare Other | Admitting: Vascular Surgery

## 2016-10-04 ENCOUNTER — Ambulatory Visit (INDEPENDENT_AMBULATORY_CARE_PROVIDER_SITE_OTHER): Payer: Medicare Other | Admitting: Vascular Surgery

## 2016-10-04 ENCOUNTER — Encounter (INDEPENDENT_AMBULATORY_CARE_PROVIDER_SITE_OTHER): Payer: Self-pay | Admitting: Vascular Surgery

## 2016-10-04 VITALS — BP 129/63 | HR 63 | Resp 15 | Ht 63.5 in | Wt 142.0 lb

## 2016-10-04 DIAGNOSIS — M79605 Pain in left leg: Secondary | ICD-10-CM

## 2016-10-04 DIAGNOSIS — E785 Hyperlipidemia, unspecified: Secondary | ICD-10-CM

## 2016-10-04 DIAGNOSIS — M79604 Pain in right leg: Secondary | ICD-10-CM

## 2016-10-04 DIAGNOSIS — F17200 Nicotine dependence, unspecified, uncomplicated: Secondary | ICD-10-CM | POA: Diagnosis not present

## 2016-10-04 NOTE — Progress Notes (Signed)
Subjective:    Patient ID: William Wilcox, male    DOB: November 22, 1940, 76 y.o.   MRN: [id number] Chief Complaint  Patient presents with  . New Evaluation    Varicose Veins   Presents as a new patient referred by doctors making house calls for painful varicose veins. Patient endorses a history of painful bilateral lower extremity varicose veins. States his varicosities have worsened over "years". He denies any trauma to his lower extremity however sounds like his right greater saphenous vein may have been harvested for CABG. His discomfort worsens throughout the day. The patient states pain in his hips with walking. The pain along his varicosities worsen with activity. He does experience some swelling in the bilateral calf. His left lower extremity is more symptomatic when compared to the right. The patient does not wear compression stockings or elevate his legs on a daily basis. He denies any rest pain or ulceration to his lower extremity. He denies any fever, nausea or vomiting.   Review of Systems  Constitutional: Negative.   HENT: Negative.   Eyes: Negative.   Respiratory: Negative.   Cardiovascular: Positive for leg swelling.       Painful varicose veins  Gastrointestinal: Negative.   Endocrine: Negative.   Genitourinary: Negative.   Musculoskeletal: Negative.   Skin: Negative.   Allergic/Immunologic: Negative.   Neurological: Negative.   Hematological: Negative.   Psychiatric/Behavioral: Negative.       Objective:   Physical Exam  Constitutional: He is oriented to person, place, and time. He appears well-developed and well-nourished. No distress.  HENT:  Head: Normocephalic and atraumatic.  Eyes: Conjunctivae are normal. Pupils are equal, round, and reactive to light.  Neck: Normal range of motion.  Cardiovascular: Normal rate, regular rhythm, normal heart sounds and intact distal pulses.   Pulses:      Radial pulses are 2+ on the right side, and 2+ on the left side.   Dorsalis pedis pulses are 2+ on the right side, and 2+ on the left side.       Posterior tibial pulses are 2+ on the right side, and 2+ on the left side.  Pulmonary/Chest: Effort normal.  Musculoskeletal: Normal range of motion. He exhibits edema (Mild bilateral edema noted).  Neurological: He is alert and oriented to person, place, and time.  Skin: Skin is warm and dry. He is not diaphoretic.  Patient with >1cm varicose veins bilaterally however the left lower extremity is more affected. Scattered spider veins noted.  Vitals reviewed.   BP 129/63 (BP Location: Right Arm)   Pulse 63   Resp 15   Ht 5' 3.5" (1.613 m)   Wt 142 lb (64.4 kg)   BMI 24.76 kg/m   Past Medical History:  Diagnosis Date  . COPD (chronic obstructive pulmonary disease) (HCC)   . Coronary artery disease   . Hypertension     Social History   Social History  . Marital status: Married    Spouse name: N/A  . Number of children: N/A  . Years of education: N/A   Occupational History  . Not on file.   Social History Main Topics  . Smoking status: Current Every Day Smoker    Packs/day: 2.00    Years: 57.00    Types: Cigarettes  . Smokeless tobacco: Never Used  . Alcohol use No  . Drug use: No  . Sexual activity: Not on file   Other Topics Concern  . Not on file   Social History  Narrative  . No narrative on file    Past Surgical History:  Procedure Laterality Date  . BACK SURGERY    . CARDIAC SURGERY     triple bypass  . HIP SURGERY Left     No family history on file.  No Known Allergies     Assessment & Plan:  Presents as a new patient referred by doctors making house calls for painful varicose veins. Patient endorses a history of painful bilateral lower extremity varicose veins. States his varicosities have worsened over "years". He denies any trauma to his lower extremity however sounds like his right greater saphenous vein may have been harvested for CABG. His discomfort worsens  throughout the day. The patient states pain in his hips with walking. The pain along his varicosities worsen with activity. He does experience some swelling in the bilateral calf. His left lower extremity is more symptomatic when compared to the right. The patient does not wear compression stockings or elevate his legs on a daily basis. He denies any rest pain or ulceration to his lower extremity. He denies any fever, nausea or vomiting.  1. 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 No acute vascular compromise to the lower extremity. The patient was encouraged to wear graduated compression stockings (20-30 mmHg) on a daily basis. The patient was instructed to begin wearing the stockings first thing in the morning and removing them in the evening. The patient was instructed specifically not to sleep in the stockings. Prescription give. In addition, behavioral modification including elevation during the day will be initiated. Anti-inflammatories for pain. I have discussed with the patient at length the risk factors for and pathogenesis of atherosclerotic disease and encouraged a healthy diet, regular exercise regimen and blood pressure / glucose control.  The patient was encouraged to call the office in the interim if he experiences any claudication like symptoms, rest pain or ulcers to his feet / toes. Patient to follow up in 3 months to assess conservative therapy  - VAS [place] ABI WITH/WO TBI; Future - VAS [place] LOWER EXTREMITY VENOUS REFLUX; Future  2. Dyslipidemia - stable Encouraged good control as its slows the progression of atherosclerotic disease  3. Tobacco use disorder - stable I have discussed (approximately 5 minutes) with the patient the role of tobacco in the pathogenesis of atherosclerosis and its effect on the progression of the disease, impact on the durability of interventions and its limitations on the formation of collateral  pathways. I have recommended absolute tobacco cessation. I have discussed various options available for assistance with tobacco cessation including over the counter methods (Nicotine gum, patch and lozenges). We also discussed prescription options (Chantix, Nicotine Inhaler / Nasal Spray). The patient is not interested in pursuing any prescription tobacco cessation options at this time. The patient voices their understanding.   Current Outpatient Prescriptions on File Prior to Visit  Medication Sig Dispense Refill  . acetaminophen (TYLENOL) 325 MG tablet Take 650 mg by mouth.    Marland Kitchen albuterol (PROVENTIL HFA;VENTOLIN HFA) 108 (90 Base) MCG/ACT inhaler Inhale into the lungs.    Marland Kitchen ammonium lactate (LAC-HYDRIN) 12 % lotion Apply 1 application topically as needed for dry skin.    . bacitracin ointment Apply to affected area daily 30 g 0  . doxycycline (VIBRAMYCIN) 100 MG capsule Take 100 mg by mouth.    . DULoxetine (CYMBALTA) 30 MG capsule Take 30 mg by mouth daily.    Marland Kitchen gabapentin (  NEURONTIN) 100 MG capsule Take 100 mg by mouth.    Marland Kitchen. guaiFENesin-dextromethorphan (ROBITUSSIN DM) 100-10 MG/5ML syrup Take 5 mLs by mouth.    Marland Kitchen. ipratropium-albuterol (DUONEB) 0.5-2.5 (3) MG/3ML SOLN Take 3 mLs by nebulization every 4 (four) hours as needed.    Marland Kitchen. lisinopril (PRINIVIL,ZESTRIL) 40 MG tablet Take 40 mg by mouth daily.    Marland Kitchen. loratadine (CLARITIN) 10 MG tablet Take 10 mg by mouth daily.    . predniSONE (DELTASONE) 20 MG tablet Take 20 mg by mouth.    . simvastatin (ZOCOR) 80 MG tablet Take 80 mg by mouth every evening.    . triamcinolone cream (KENALOG) 0.1 % Apply 1 application topically.     No current facility-administered medications on file prior to visit.     There are no Patient Instructions on file for this visit. No Follow-up on file.   Elery Cadenhead A Brode Sculley, PA-C

## 2017-01-06 ENCOUNTER — Encounter (INDEPENDENT_AMBULATORY_CARE_PROVIDER_SITE_OTHER): Payer: Medicare Other

## 2017-01-06 ENCOUNTER — Ambulatory Visit (INDEPENDENT_AMBULATORY_CARE_PROVIDER_SITE_OTHER): Payer: Medicare Other | Admitting: Vascular Surgery

## 2017-02-09 DIAGNOSIS — M79606 Pain in leg, unspecified: Secondary | ICD-10-CM | POA: Insufficient documentation

## 2017-02-10 ENCOUNTER — Encounter (INDEPENDENT_AMBULATORY_CARE_PROVIDER_SITE_OTHER): Payer: Medicare Other

## 2017-02-10 ENCOUNTER — Ambulatory Visit (INDEPENDENT_AMBULATORY_CARE_PROVIDER_SITE_OTHER): Payer: Medicare Other | Admitting: Vascular Surgery

## 2017-03-28 ENCOUNTER — Emergency Department
Admission: EM | Admit: 2017-03-28 | Discharge: 2017-03-29 | Disposition: A | Discharge status: Home / Self Care | Payer: Medicare Other | Attending: Emergency Medicine | Admitting: Emergency Medicine

## 2017-03-28 ENCOUNTER — Encounter: Payer: Self-pay | Admitting: Emergency Medicine

## 2017-03-28 ENCOUNTER — Other Ambulatory Visit: Payer: Self-pay

## 2017-03-28 ENCOUNTER — Emergency Department: Payer: Medicare Other

## 2017-03-28 DIAGNOSIS — Z79899 Other long term (current) drug therapy: Secondary | ICD-10-CM | POA: Diagnosis not present

## 2017-03-28 DIAGNOSIS — F17210 Nicotine dependence, cigarettes, uncomplicated: Secondary | ICD-10-CM | POA: Insufficient documentation

## 2017-03-28 DIAGNOSIS — J449 Chronic obstructive pulmonary disease, unspecified: Secondary | ICD-10-CM | POA: Diagnosis not present

## 2017-03-28 DIAGNOSIS — I10 Essential (primary) hypertension: Secondary | ICD-10-CM | POA: Insufficient documentation

## 2017-03-28 DIAGNOSIS — I2510 Atherosclerotic heart disease of native coronary artery without angina pectoris: Secondary | ICD-10-CM | POA: Insufficient documentation

## 2017-03-28 DIAGNOSIS — F329 Major depressive disorder, single episode, unspecified: Secondary | ICD-10-CM | POA: Insufficient documentation

## 2017-03-28 DIAGNOSIS — M79605 Pain in left leg: Secondary | ICD-10-CM | POA: Diagnosis not present

## 2017-03-28 LAB — CBC WITH DIFFERENTIAL/PLATELET
BASOS ABS: 0 10*3/uL (ref 0–0.1)
BASOS PCT: 1 %
Eosinophils Absolute: 0.2 10*3/uL (ref 0–0.7)
Eosinophils Relative: 3 %
HEMATOCRIT: 39.9 % — AB (ref 40.0–52.0)
HEMOGLOBIN: 13.7 g/dL (ref 13.0–18.0)
LYMPHS PCT: 38 %
Lymphs Abs: 3.1 10*3/uL (ref 1.0–3.6)
MCH: 29.8 pg (ref 26.0–34.0)
MCHC: 34.3 g/dL (ref 32.0–36.0)
MCV: 87.1 fL (ref 80.0–100.0)
MONOS PCT: 7 %
Monocytes Absolute: 0.6 10*3/uL (ref 0.2–1.0)
NEUTROS ABS: 4.1 10*3/uL (ref 1.4–6.5)
NEUTROS PCT: 51 %
Platelets: 141 10*3/uL — ABNORMAL LOW (ref 150–440)
RBC: 4.58 MIL/uL (ref 4.40–5.90)
RDW: 15.5 % — ABNORMAL HIGH (ref 11.5–14.5)
WBC: 8.1 10*3/uL (ref 3.8–10.6)

## 2017-03-28 LAB — BASIC METABOLIC PANEL
ANION GAP: 7 (ref 5–15)
BUN: 26 mg/dL — ABNORMAL HIGH (ref 6–20)
CALCIUM: 9.1 mg/dL (ref 8.9–10.3)
CO2: 23 mmol/L (ref 22–32)
Chloride: 109 mmol/L (ref 101–111)
Creatinine, Ser: 1.31 mg/dL — ABNORMAL HIGH (ref 0.61–1.24)
GFR, EST AFRICAN AMERICAN: 59 mL/min — AB (ref >60)
GFR, EST NON AFRICAN AMERICAN: 51 mL/min — AB (ref >60)
Glucose, Bld: 103 mg/dL — ABNORMAL HIGH (ref 65–99)
POTASSIUM: 4.1 mmol/L (ref 3.5–5.1)
Sodium: 139 mmol/L (ref 135–145)

## 2017-03-28 MED ORDER — MORPHINE SULFATE (PF) 4 MG/ML IV SOLN
4.0000 mg | Freq: Once | INTRAVENOUS | Status: AC
  Administered 2017-03-28: 4 mg via INTRAVENOUS
  Filled 2017-03-28: qty 1

## 2017-03-28 MED ORDER — HYDROCODONE-ACETAMINOPHEN 5-325 MG PO TABS: 1.0000 | ORAL_TABLET | Freq: Four times a day (QID) | ORAL | 0 refills | Status: AC | PRN

## 2017-03-28 NOTE — ED Notes (Signed)
Pt taken to US at this time

## 2017-03-28 NOTE — ED Provider Notes (Addendum)
[facility] Emergency Department Provider Note ____________________________________________   First MD Initiated Contact with Patient 03/28/17 2145     (approximate)  I have reviewed the triage vital signs and the nursing notes.   HISTORY  Chief Complaint Leg Pain    HPI William Wilcox is a 76 y.o. male with history of COPD and other past medical history as noted below who presents with left leg pain, ranging from the ankle to the calf area, described as sharp, and associated with distended varicose veins to the leg.  Patient denies any fall or trauma.  He denies numbness or weakness, and he denies any chest pain or difficulty breathing.  He states he has had this pain multiple times before.  Past Medical History:  Diagnosis Date  . COPD (chronic obstructive pulmonary disease) (HCC)   . Coronary artery disease   . Hypertension     Patient Active Problem List   Diagnosis Date Noted  . Leg pain 02/09/2017  . Lower extremity pain, bilateral 10/04/2016  . Dyslipidemia 04/14/2015  . Tobacco use disorder 04/14/2015  . 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 Major depressive disorder, single episode, severe without psychotic features (HCC) 04/13/2015  . COPD (chronic obstructive pulmonary disease) (HCC) 04/13/2015  . Chronic hip pain 04/13/2015  . Hypertension 04/13/2015    Past Surgical History:  Procedure Laterality Date  . BACK SURGERY    . CARDIAC SURGERY     triple bypass  . HIP SURGERY Left     Prior to Admission medications   Medication Sig Start Date End Date Taking? Authorizing Provider  acetaminophen (TYLENOL) 325 MG tablet Take 650 mg by mouth.    [provider]  albuterol (PROVENTIL HFA;VENTOLIN HFA) 108 (90 Base) MCG/ACT inhaler Inhale into the lungs.    [provider]  ammonium lactate (LAC-HYDRIN) 12 % lotion Apply 1 application topically as needed for dry skin.     [provider]  bacitracin ointment Apply to affected area daily 07/11/16 07/11/17  Orvil FeilWoods, Jaclyn M, PA-C  doxycycline (VIBRAMYCIN) 100 MG capsule Take 100 mg by mouth.    [provider]  DULoxetine (CYMBALTA) 30 MG capsule Take 30 mg by mouth daily.    [provider]  gabapentin (NEURONTIN) 100 MG capsule Take 100 mg by mouth.    [provider]  guaiFENesin-dextromethorphan (ROBITUSSIN DM) 100-10 MG/5ML syrup Take 5 mLs by mouth.    [provider]  ibuprofen (ADVIL,MOTRIN) 600 MG tablet Take 600 mg by mouth. 10/23/15   [provider]  ipratropium-albuterol (DUONEB) 0.5-2.5 (3) MG/3ML SOLN Take 3 mLs by nebulization every 4 (four) hours as needed.    [provider]  lisinopril (PRINIVIL,ZESTRIL) 40 MG tablet Take 40 mg by mouth daily.    [provider]  loratadine (CLARITIN) 10 MG tablet Take 10 mg by mouth daily.    [provider]  predniSONE (DELTASONE) 20 MG tablet Take 20 mg by mouth.    [provider]  simvastatin (ZOCOR) 80 MG tablet Take 80 mg by mouth every evening.    [provider]  triamcinolone cream (KENALOG) 0.1 % Apply 1 application topically.    [provider]    Allergies Patient has no known allergies.  History reviewed. No pertinent family history.  Social History Social History   Tobacco Use  . Smoking status: Current Every Day Smoker    Packs/day: 2.00    Years: 57.00  Pack years: 114.00    Types: Cigarettes  . Smokeless tobacco: Never Used  Substance Use Topics  . Alcohol use: No  . Drug use: No    Review of Systems  Constitutional: No fever. Eyes: No redness. ENT: No sore throat. Cardiovascular: Denies chest pain. Respiratory: Denies shortness of breath. Gastrointestinal: No nausea, no vomiting.   Genitourinary: Negative for dysuria.  Musculoskeletal: Negative for back pain. Skin: Negative for rash. Neurological: Negative for focal  weakness or numbness.   ____________________________________________   PHYSICAL EXAM:  VITAL SIGNS: ED Triage Vitals  Enc Vitals Group     BP 03/28/17 2138 133/62     Pulse Rate 03/28/17 2138 69     Resp 03/28/17 2138 20     Temp 03/28/17 2138 98 F (36.7 C)     Temp Source 03/28/17 2138 Oral     SpO2 03/28/17 2138 96 %     Weight 03/28/17 2138 145 lb (65.8 kg)     Height 03/28/17 2138 5\' 6"  (1.676 m)     Head Circumference --      Peak Flow --      Pain Score 03/28/17 2137 9     Pain Loc --      Pain Edu? --      Excl. in GC? --     Constitutional: Alert and oriented. Well appearing and in no acute distress. Eyes: Conjunctivae are normal.  Head: Atraumatic. Nose: No congestion/rhinnorhea. Mouth/Throat: Mucous membranes are moist.   Neck: Normal range of motion.  Cardiovascular: Good peripheral circulation. Respiratory: Normal respiratory effort.  No retractions.  Gastrointestinal:  No distention.  Genitourinary: No flank tenderness. Musculoskeletal: No lower extremity edema.  Extremities warm and well perfused.  2+ DP pulses to bilateral lower extremities.  Full range of motion at left ankle, knee, and hip.  No erythema, induration, abnormal warmth, or color change.  Slightly distended varicose veins to the left lower extremity. Neurologic:  Normal speech and language. No gross focal neurologic deficits are appreciated.  Skin:  Skin is warm and dry. No rash noted. Psychiatric: Mood and affect are normal. Speech and behavior are normal.  ____________________________________________   LABS (all labs ordered are listed, but only abnormal results are displayed)  Labs Reviewed  BASIC METABOLIC PANEL - Abnormal; Notable for the following components:      Result Value   Glucose, Bld 103 (*)    BUN 26 (*)    Creatinine, Ser 1.31 (*)    GFR calc non Af Amer 51 (*)    GFR calc Af Amer 59 (*)    All other components within normal limits  CBC WITH DIFFERENTIAL/PLATELET -  Abnormal; Notable for the following components:   HCT 39.9 (*)    RDW 15.5 (*)    Platelets 141 (*)    All other components within normal limits   ____________________________________________  EKG   ____________________________________________  RADIOLOGY  RLE [place]: pending  ____________________________________________   PROCEDURES  Procedure(s) performed: No    Critical Care performed: No ____________________________________________   INITIAL IMPRESSION / ASSESSMENT AND PLAN / ED COURSE  Pertinent labs & imaging results that were available during my care of the patient were reviewed by me and considered in my medical decision making (see chart for details).  76 year old male with past medical history as noted above presents with left leg pain for the last several hours, and has had similar pain previously.  On review of past medical records in Epic, patient was seen  previously in the emergency department in February of this year for left lower extremity pain with negative imaging, and otherwise has been seen in the ED for psychiatric complaints.  On my exam, the vital signs are normal, the patient is comfortable appearing, in the left lower extremity exam is unremarkable except for varicose veins.  He has good distal pulses bilaterally, and I do not note any duskiness or abnormal coloration as was noted by the nurse in triage.  He has full range of motion at all joints and the leg is neurologically intact.  Differential includes neuropathy, muscle strain or spasm, pain from varicose veins, or less likely DVT.  Also consider Baker's cyst.  There is no clinical evidence for arterial insufficiency or embolism.  Plan for DVT study, analgesia, and reassess.      ----------------------------------------- 11:39 PM on 03/28/2017 -----------------------------------------  DVT study is negative.  Patient states pain is significantly improved.  On repeat exam he continues to have  full range of motion, and the extremity is neurovascular intact with no abnormal coloration, cap refill <2sec and 2+ DP pulse.  Patient feels comfortable to go back to his facility. ____________________________________________   FINAL CLINICAL IMPRESSION(S) / ED DIAGNOSES  Final diagnoses:  Left leg pain      NEW MEDICATIONS STARTED DURING THIS VISIT:  This SmartLink is deprecated. Use AVSMEDLIST instead to display the medication list for a patient.   Note:  This document was prepared using Dragon voice recognition software and may include unintentional dictation errors.    Dionne BucySiadecki, Shawnese Magner, MD 03/28/17 [id number]    Dionne BucySiadecki, Kay Shippy, MD 03/28/17 [id number]    Dionne BucySiadecki, Shelley Cocke, MD 03/28/17 2340

## 2017-03-28 NOTE — Discharge Instructions (Signed)
Return to the emergency department for new, worsening, or constant leg pain, weakness or numbness, difficulty walking or putting weight on the leg, swelling, or any other new or worsening symptoms that concern you.  You should be evaluated by your primary care doctor within the next week.

## 2017-03-28 NOTE — ED Triage Notes (Signed)
Pt presents to ED via [facility] from the [facility] [place] with c/o L leg pain distal to L knee x 1 hr. Pt presents with +pedal pulse, L foot noted to be warm, pt with decreased motor, decreased sensation, and dusky coloring noted to L foot, pt also noted to have bulging veins to L leg. Pt states pain relief with elevation of extremity, no known injury to L leg.

## 2017-03-28 NOTE — ED Notes (Signed)
After hours DSS social worker Okey RegalLisa Powell, notified of patient's presence in the ER per the FYI stating patient was a ward of [facility].

## 2017-07-21 ENCOUNTER — Emergency Department
Admission: EM | Admit: 2017-07-21 | Discharge: 2017-07-21 | Disposition: A | Discharge status: Home / Self Care | Payer: Medicare Other | Attending: Emergency Medicine | Admitting: Emergency Medicine

## 2017-07-21 ENCOUNTER — Other Ambulatory Visit: Payer: Self-pay

## 2017-07-21 ENCOUNTER — Encounter: Payer: Self-pay | Admitting: *Deleted

## 2017-07-21 DIAGNOSIS — F17210 Nicotine dependence, cigarettes, uncomplicated: Secondary | ICD-10-CM | POA: Diagnosis not present

## 2017-07-21 DIAGNOSIS — J449 Chronic obstructive pulmonary disease, unspecified: Secondary | ICD-10-CM | POA: Insufficient documentation

## 2017-07-21 DIAGNOSIS — M542 Cervicalgia: Secondary | ICD-10-CM | POA: Diagnosis present

## 2017-07-21 DIAGNOSIS — I10 Essential (primary) hypertension: Secondary | ICD-10-CM | POA: Insufficient documentation

## 2017-07-21 DIAGNOSIS — I2510 Atherosclerotic heart disease of native coronary artery without angina pectoris: Secondary | ICD-10-CM | POA: Insufficient documentation

## 2017-07-21 DIAGNOSIS — M436 Torticollis: Secondary | ICD-10-CM | POA: Diagnosis not present

## 2017-07-21 LAB — BASIC METABOLIC PANEL
Anion gap: 6 (ref 5–15)
BUN: 20 mg/dL (ref 6–20)
CALCIUM: 9.4 mg/dL (ref 8.9–10.3)
CO2: 26 mmol/L (ref 22–32)
CREATININE: 1.11 mg/dL (ref 0.61–1.24)
Chloride: 107 mmol/L (ref 101–111)
GFR calc Af Amer: >60 mL/min (ref >60)
GLUCOSE: 91 mg/dL (ref 65–99)
Potassium: 5 mmol/L (ref 3.5–5.1)
Sodium: 139 mmol/L (ref 135–145)

## 2017-07-21 LAB — CBC
HCT: 41.3 % (ref 40.0–52.0)
Hemoglobin: 14.1 g/dL (ref 13.0–18.0)
MCH: 30 pg (ref 26.0–34.0)
MCHC: 34.3 g/dL (ref 32.0–36.0)
MCV: 87.6 fL (ref 80.0–100.0)
PLATELETS: 120 10*3/uL — AB (ref 150–440)
RBC: 4.71 MIL/uL (ref 4.40–5.90)
RDW: 14.2 % (ref 11.5–14.5)
WBC: 8.4 10*3/uL (ref 3.8–10.6)

## 2017-07-21 LAB — TROPONIN I

## 2017-07-21 MED ORDER — DIAZEPAM 5 MG PO TABS
5.0000 mg | ORAL_TABLET | Freq: Once | ORAL | Status: AC
  Administered 2017-07-21: 5 mg via ORAL
  Filled 2017-07-21: qty 1

## 2017-07-21 MED ORDER — TRAMADOL HCL 50 MG PO TABS
50.0000 mg | ORAL_TABLET | Freq: Once | ORAL | Status: AC
  Administered 2017-07-21: 50 mg via ORAL
  Filled 2017-07-21: qty 1

## 2017-07-21 MED ORDER — IBUPROFEN 400 MG PO TABS: 400.0000 mg | ORAL_TABLET | Freq: Three times a day (TID) | ORAL | 0 refills | Status: DC | PRN

## 2017-07-21 MED ORDER — DIAZEPAM 5 MG PO TABS: 5.0000 mg | ORAL_TABLET | Freq: Three times a day (TID) | ORAL | 0 refills | Status: DC | PRN

## 2017-07-21 NOTE — ED Notes (Signed)
Pt taken back to the [facility] via [facility]. VSS. NAD. Pt was sleeping when EMS arrived.

## 2017-07-21 NOTE — ED Provider Notes (Signed)
[facility] Provider Note       Time seen: ----------------------------------------- 6:57 PM on 07/21/2017 -----------------------------------------   I have reviewed the triage vital signs and the nursing notes.  HISTORY   Chief Complaint Neck Pain    HPI William Wilcox is a 77 y.o. male with a history of COPD, coronary artery disease, hypertension who presents to the ED for neck pain for the past 3 days.  Patient states the pain is intermittent and is on the right side of his neck.  He has not had any known injury, he woke up with it and it was hurting.  He denies chest pain or shortness of breath.  He reports that he had a neck and shoulder x-ray done about a week ago.  He does report the pain is worse with movement, he is currently from the [facility].  Past Medical History:  Diagnosis Date  . COPD (chronic obstructive pulmonary disease) (HCC)   . Coronary artery disease   . Hypertension     Patient Active Problem List   Diagnosis Date Noted  . Leg pain 02/09/2017  . Lower extremity pain, bilateral 10/04/2016  . Dyslipidemia 04/14/2015  . Tobacco use disorder 04/14/2015  . 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 Major depressive disorder, single episode, severe without psychotic features (HCC) 04/13/2015  . COPD (chronic obstructive pulmonary disease) (HCC) 04/13/2015  . Chronic hip pain 04/13/2015  . Hypertension 04/13/2015    Past Surgical History:  Procedure Laterality Date  . BACK SURGERY    . CARDIAC SURGERY     triple bypass  . HIP SURGERY Left     Allergies Patient has no known allergies.  Social History Social History   Tobacco Use  . Smoking status: Current Every Day Smoker    Packs/day: 2.00    Years: 57.00    Pack years: 114.00    Types: Cigarettes  . Smokeless tobacco: Never Used  Substance Use Topics  . Alcohol use: No  . Drug use: No   Review of  Systems Constitutional: Negative for fever. Cardiovascular: Negative for chest pain. Respiratory: Negative for shortness of breath. Gastrointestinal: Negative for abdominal pain, vomiting and diarrhea. Musculoskeletal: Positive for neck pain Skin: Negative for rash. Neurological: Negative for headaches, focal weakness or numbness.  Negative for radicular pain  All systems negative/normal/unremarkable except as stated in the HPI  ____________________________________________   PHYSICAL EXAM:  VITAL SIGNS: ED Triage Vitals  Enc Vitals Group     BP 07/21/17 1609 (!) 140/57     Pulse Rate 07/21/17 1609 76     Resp 07/21/17 1609 20     Temp 07/21/17 1609 97.6 F (36.4 C)     Temp Source 07/21/17 1609 Oral     SpO2 07/21/17 1609 96 %     Weight 07/21/17 1607 145 lb (65.8 kg)     Height 07/21/17 1607 5\' 5"  (1.651 m)     Head Circumference --      Peak Flow --      Pain Score 07/21/17 1607 10     Pain Loc --      Pain Edu? --      Excl. in GC? --    Constitutional: Alert and oriented. Well appearing and in no distress. ENT   Head: Normocephalic and atraumatic.   Nose: No congestion/rhinnorhea.   Mouth/Throat: Mucous membranes are moist.   Neck: No stridor.  Right-sided trapezius muscle tenderness Cardiovascular:  Normal rate, regular rhythm. No murmurs, rubs, or gallops. Respiratory: Normal respiratory effort without tachypnea nor retractions. Breath sounds are clear and equal bilaterally. No wheezes/rales/rhonchi. Gastrointestinal: Soft and nontender. Normal bowel sounds Musculoskeletal: Nontender with normal range of motion in extremities. No lower extremity tenderness nor edema. Neurologic:  Normal speech and language. No gross focal neurologic deficits are appreciated.  No radicular symptoms are noted, normal sensory and motor exam in the right upper extremity. Skin:  Skin is warm, dry and intact. No rash noted. Psychiatric: Depressed mood and  affect ____________________________________________  ED COURSE:  As part of my medical decision making, I reviewed the following data within the electronic MEDICAL RECORD NUMBER History obtained from family if available, nursing notes, old chart and ekg, as well as notes from prior ED visits. Patient presented for right-sided neck pain, we will assess with labs and imaging as indicated at this time.   Procedures ____________________________________________   LABS (pertinent positives/negatives)  Labs Reviewed  CBC - Abnormal; Notable for the following components:      Result Value   Platelets 120 (*)    All other components within normal limits  BASIC METABOLIC PANEL  TROPONIN I  ____________________________________________  DIFFERENTIAL DIAGNOSIS   Torticollis, muscle strain, cervical disc disease  FINAL ASSESSMENT AND PLAN  Torticollis   Plan: The patient had presented for symptoms of torticollis. Patient's labs are normal.  He did not require further imaging at this time.  He be discharged with Valium and ibuprofen and is cleared for outpatient follow-up.   Ulice Dash, MD   Note: This note was generated in part or whole with voice recognition software. Voice recognition is usually quite accurate but there are transcription errors that can and very often do occur. I apologize for any typographical errors that were not detected and corrected.     Emily Filbert, MD 07/21/17 [phone]

## 2017-07-21 NOTE — ED Notes (Signed)
Charge RN note: Per EMS patient here for neck pain x1 week, VSS, no acute distress noted.  Patient to triage via EMS.

## 2017-07-21 NOTE — ED Triage Notes (Addendum)
Pt brought in via ems from a rest home.  Pt reports neck pain for 3 days.  No known injury.  Pt woke up and it was hurting.  No chest pain or sob  Hx cabg  Pt alert  Speech clear.

## 2017-07-21 NOTE — ED Triage Notes (Signed)
FIRST NURSE NOTE-right neck pain into shoulder. 1 week ago.  Got xray of shoulder 1 week ago.  Came [facility].  No other symptoms.  Worse with movement.  [facility].

## 2017-09-28 ENCOUNTER — Emergency Department: Payer: Medicare Other

## 2017-09-28 ENCOUNTER — Emergency Department
Admission: EM | Admit: 2017-09-28 | Discharge: 2017-09-29 | Disposition: A | Discharge status: Nursing Home (Includes ICF) | Payer: Medicare Other | Attending: Emergency Medicine | Admitting: Emergency Medicine

## 2017-09-28 ENCOUNTER — Other Ambulatory Visit: Payer: Self-pay

## 2017-09-28 DIAGNOSIS — I10 Essential (primary) hypertension: Secondary | ICD-10-CM | POA: Diagnosis not present

## 2017-09-28 DIAGNOSIS — J449 Chronic obstructive pulmonary disease, unspecified: Secondary | ICD-10-CM | POA: Insufficient documentation

## 2017-09-28 DIAGNOSIS — E860 Dehydration: Secondary | ICD-10-CM | POA: Diagnosis not present

## 2017-09-28 DIAGNOSIS — Z79899 Other long term (current) drug therapy: Secondary | ICD-10-CM | POA: Diagnosis not present

## 2017-09-28 DIAGNOSIS — R0602 Shortness of breath: Secondary | ICD-10-CM | POA: Diagnosis not present

## 2017-09-28 DIAGNOSIS — F17210 Nicotine dependence, cigarettes, uncomplicated: Secondary | ICD-10-CM | POA: Diagnosis not present

## 2017-09-28 DIAGNOSIS — I259 Chronic ischemic heart disease, unspecified: Secondary | ICD-10-CM | POA: Insufficient documentation

## 2017-09-28 DIAGNOSIS — R531 Weakness: Secondary | ICD-10-CM

## 2017-09-28 DIAGNOSIS — M6281 Muscle weakness (generalized): Secondary | ICD-10-CM | POA: Diagnosis not present

## 2017-09-28 LAB — COMPREHENSIVE METABOLIC PANEL
ALK PHOS: 80 U/L (ref 38–126)
ALT: 16 U/L — AB (ref 17–63)
AST: 20 U/L (ref 15–41)
Albumin: 4 g/dL (ref 3.5–5.0)
Anion gap: 7 (ref 5–15)
BILIRUBIN TOTAL: 0.5 mg/dL (ref 0.3–1.2)
BUN: 34 mg/dL — AB (ref 6–20)
CALCIUM: 9.1 mg/dL (ref 8.9–10.3)
CO2: 20 mmol/L — ABNORMAL LOW (ref 22–32)
CREATININE: 1.67 mg/dL — AB (ref 0.61–1.24)
Chloride: 109 mmol/L (ref 101–111)
GFR calc Af Amer: 44 mL/min — ABNORMAL LOW (ref >60)
GFR, EST NON AFRICAN AMERICAN: 38 mL/min — AB (ref >60)
Glucose, Bld: 123 mg/dL — ABNORMAL HIGH (ref 65–99)
Potassium: 5 mmol/L (ref 3.5–5.1)
Sodium: 136 mmol/L (ref 135–145)
TOTAL PROTEIN: 7 g/dL (ref 6.5–8.1)

## 2017-09-28 LAB — CBC WITH DIFFERENTIAL/PLATELET
BASOS ABS: 0 10*3/uL (ref 0–0.1)
BASOS PCT: 0 %
EOS ABS: 0.3 10*3/uL (ref 0–0.7)
EOS PCT: 2 %
HCT: 36.1 % — ABNORMAL LOW (ref 40.0–52.0)
Hemoglobin: 12.8 g/dL — ABNORMAL LOW (ref 13.0–18.0)
LYMPHS ABS: 3.1 10*3/uL (ref 1.0–3.6)
Lymphocytes Relative: 27 %
MCH: 30.9 pg (ref 26.0–34.0)
MCHC: 35.5 g/dL (ref 32.0–36.0)
MCV: 87.1 fL (ref 80.0–100.0)
Monocytes Absolute: 0.5 10*3/uL (ref 0.2–1.0)
Monocytes Relative: 4 %
Neutro Abs: 7.8 10*3/uL — ABNORMAL HIGH (ref 1.4–6.5)
Neutrophils Relative %: 67 %
PLATELETS: 131 10*3/uL — AB (ref 150–440)
RBC: 4.15 MIL/uL — AB (ref 4.40–5.90)
RDW: 15.4 % — ABNORMAL HIGH (ref 11.5–14.5)
WBC: 11.7 10*3/uL — AB (ref 3.8–10.6)

## 2017-09-28 LAB — BLOOD GAS, VENOUS
ACID-BASE DEFICIT: 2.9 mmol/L — AB (ref 0.0–2.0)
BICARBONATE: 22 mmol/L (ref 20.0–28.0)
O2 SAT: 86.6 %
PCO2 VEN: 38 mmHg — AB (ref 44.0–60.0)
PH VEN: 7.37 (ref 7.250–7.430)
Patient temperature: 37
pO2, Ven: 54 mmHg — ABNORMAL HIGH (ref 32.0–45.0)

## 2017-09-28 LAB — GLUCOSE, CAPILLARY
Glucose-Capillary: 117 mg/dL — ABNORMAL HIGH (ref 65–99)
Glucose-Capillary: 125 mg/dL — ABNORMAL HIGH (ref 65–99)

## 2017-09-28 LAB — TROPONIN I

## 2017-09-28 NOTE — ED Notes (Signed)
First RN note:  [facility] via [facility] from [facility].  Patient walked to the [employer], has COPD, and is current smoker and is now c/o Shortness of breath.  No wheezing audible at this time and patient has stable VSS and even and unlabored respirations.

## 2017-09-28 NOTE — Discharge Instructions (Signed)
Fortunately today your chest x-ray, your EKG, and your blood work were reassuring.  Please follow-up with your primary care physician within 1 week for reevaluation and return to the emergency department sooner for any concerns.  It was a pleasure to take care of you today, and thank you for coming to our emergency department.  If you have any questions or concerns before leaving please ask the nurse to grab me and I'm more than happy to go through your aftercare instructions again.  If you were prescribed any opioid pain medication today such as Norco, Vicodin, Percocet, morphine, hydrocodone, or oxycodone please make sure you do not drive when you are taking this medication as it can alter your ability to drive safely.  If you have any concerns once you are home that you are not improving or are in fact getting worse before you can make it to your follow-up appointment, please do not hesitate to call 911 and come back for further evaluation.  Merrily BrittleNeil Ahman Dugdale, MD  Results for orders placed or performed during the hospital encounter of 09/28/17  Glucose, capillary  Result Value Ref Range   Glucose-Capillary 117 (H) 65 - 99 mg/dL  Blood gas, venous  Result Value Ref Range   pH, Ven 7.37 7.250 - 7.430   pCO2, Ven 38 (L) 44.0 - 60.0 mmHg   pO2, Ven 54.0 (H) 32.0 - 45.0 mmHg   Bicarbonate 22.0 20.0 - 28.0 mmol/L   Acid-base deficit 2.9 (H) 0.0 - 2.0 mmol/L   O2 Saturation 86.6 %   Patient temperature 37.0    Collection site VENOUS    Sample type VENOUS   Comprehensive metabolic panel  Result Value Ref Range   Sodium 136 135 - 145 mmol/L   Potassium 5.0 3.5 - 5.1 mmol/L   Chloride 109 101 - 111 mmol/L   CO2 20 (L) 22 - 32 mmol/L   Glucose, Bld 123 (H) 65 - 99 mg/dL   BUN 34 (H) 6 - 20 mg/dL   Creatinine, Ser 2.131.67 (H) 0.61 - 1.24 mg/dL   Calcium 9.1 8.9 - 08.610.3 mg/dL   Total Protein 7.0 6.5 - 8.1 g/dL   Albumin 4.0 3.5 - 5.0 g/dL   AST 20 15 - 41 U/L   ALT 16 (L) 17 - 63 U/L   Alkaline  Phosphatase 80 38 - 126 U/L   Total Bilirubin 0.5 0.3 - 1.2 mg/dL   GFR calc non Af Amer 38 (L) >60 mL/min   GFR calc Af Amer 44 (L) >60 mL/min   Anion gap 7 5 - 15  CBC with Differential  Result Value Ref Range   WBC 11.7 (H) 3.8 - 10.6 K/uL   RBC 4.15 (L) 4.40 - 5.90 MIL/uL   Hemoglobin 12.8 (L) 13.0 - 18.0 g/dL   HCT 57.836.1 (L) 46.940.0 - 62.952.0 %   MCV 87.1 80.0 - 100.0 fL   MCH 30.9 26.0 - 34.0 pg   MCHC 35.5 32.0 - 36.0 g/dL   RDW 52.815.4 (H) 41.311.5 - 24.414.5 %   Platelets 131 (L) 150 - 440 K/uL   Neutrophils Relative % 67 %   Neutro Abs 7.8 (H) 1.4 - 6.5 K/uL   Lymphocytes Relative 27 %   Lymphs Abs 3.1 1.0 - 3.6 K/uL   Monocytes Relative 4 %   Monocytes Absolute 0.5 0.2 - 1.0 K/uL   Eosinophils Relative 2 %   Eosinophils Absolute 0.3 0 - 0.7 K/uL   Basophils Relative 0 %  Basophils Absolute 0.0 0 - 0.1 K/uL  Troponin I  Result Value Ref Range   Troponin I <0.03 <0.03 ng/mL  Glucose, capillary  Result Value Ref Range   Glucose-Capillary 125 (H) 65 - 99 mg/dL   Dg Chest Portable 1 View  Result Date: 09/28/2017 CLINICAL DATA:  Shortness of breath after walking to [employer] store. History of COPD. EXAM: PORTABLE CHEST 1 VIEW COMPARISON:  Chest radiograph March 04, 2017 FINDINGS: Cardiomediastinal silhouette is normal, status post median sternotomy for CABG. Calcified aortic knob. Chronic interstitial changes without pleural effusion or focal consolidation. No pneumothorax. Soft tissue planes and included osseous structures are non suspicious. IMPRESSION: Stable chronic interstitial changes. Aortic Atherosclerosis (ICD10-I70.0). Electronically Signed   By: Awilda Metro M.D.   On: 09/28/2017 22:21

## 2017-09-28 NOTE — ED Triage Notes (Signed)
[facility] is [facility] the [facility]. Pt does have COPD, no shob at present but does seem sleepy in triage.

## 2017-09-28 NOTE — ED Provider Notes (Signed)
[facility] Emergency Department Provider Note  ____________________________________________   First MD Initiated Contact with Patient 09/28/17 2154     (approximate)  I have reviewed the triage vital signs and the nursing notes.   HISTORY  Chief Complaint Weakness and Shortness of Breath    HPI William Wilcox is a 77 y.o. male who comes to the emergency department via EMS with generalized malaise and shortness of breath.  He resides at a nursing home and today went out for a walk in the heat when he began to feel "tired".  He has a past medical history of coronary artery disease, hypertension, and COPD which prompted the visit today.  He is currently asymptomatic and said he did not want to come to the hospital and he would like to go home.  He never had any chest pain.  He denies abdominal pain nausea or vomiting.  His symptoms were somewhat worse when being out in the sun and now clearly improved with rest.  They were mild to moderate in severity.  Collateral history obtained by telephone with employee at the [facility].  Apparently the patient was walking today" got overheated".   Past Medical History:  Diagnosis Date  . COPD (chronic obstructive pulmonary disease) (HCC)   . Coronary artery disease   . Hypertension     Patient Active Problem List   Diagnosis Date Noted  . Leg pain 02/09/2017  . Lower extremity pain, bilateral 10/04/2016  . Dyslipidemia 04/14/2015  . Tobacco use disorder 04/14/2015  . 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 Major depressive disorder, single episode, severe without psychotic features (HCC) 04/13/2015  . COPD (chronic obstructive pulmonary disease) (HCC) 04/13/2015  . Chronic hip pain 04/13/2015  . Hypertension 04/13/2015    Past Surgical History:  Procedure Laterality Date  . BACK SURGERY    . CARDIAC SURGERY     triple bypass  . HIP SURGERY Left     Prior to  Admission medications   Medication Sig Start Date End Date Taking? Authorizing Provider  acetaminophen (TYLENOL) 325 MG tablet Take 650 mg by mouth.    [provider]  albuterol (PROVENTIL HFA;VENTOLIN HFA) 108 (90 Base) MCG/ACT inhaler Inhale into the lungs.    [provider]  ammonium lactate (LAC-HYDRIN) 12 % lotion Apply 1 application topically as needed for dry skin.    [provider]  diazepam (VALIUM) 5 MG tablet Take 1 tablet (5 mg total) by mouth every 8 (eight) hours as needed for muscle spasms. 07/21/17   Emily FilbertWilliams, Jonathan E, MD  doxycycline (VIBRAMYCIN) 100 MG capsule Take 100 mg by mouth.    [provider]  DULoxetine (CYMBALTA) 30 MG capsule Take 30 mg by mouth daily.    [provider]  gabapentin (NEURONTIN) 100 MG capsule Take 100 mg by mouth.    [provider]  guaiFENesin-dextromethorphan (ROBITUSSIN DM) 100-10 MG/5ML syrup Take 5 mLs by mouth.    [provider]  ibuprofen (ADVIL,MOTRIN) 400 MG tablet Take 1 tablet (400 mg total) by mouth every 8 (eight) hours as needed. 07/21/17   Emily FilbertWilliams, Jonathan E, MD  ibuprofen (ADVIL,MOTRIN) 600 MG tablet Take 600 mg by mouth. 10/23/15   [provider]  ipratropium-albuterol (DUONEB) 0.5-2.5 (3) MG/3ML SOLN Take 3 mLs by nebulization every 4 (four) hours as needed.    [provider]  lisinopril (PRINIVIL,ZESTRIL) 40 MG tablet Take 40 mg by mouth daily.  [provider]  loratadine (CLARITIN) 10 MG tablet Take 10 mg by mouth daily.    [provider]  predniSONE (DELTASONE) 20 MG tablet Take 20 mg by mouth.    [provider]  simvastatin (ZOCOR) 80 MG tablet Take 80 mg by mouth every evening.    [provider]  triamcinolone cream (KENALOG) 0.1 % Apply 1 application topically.    [provider]    Allergies Patient has no known allergies.  No family history on file.  Social History Social History     Tobacco Use  . Smoking status: Current Every Day Smoker    Packs/day: 2.00    Years: 57.00    Pack years: 114.00    Types: Cigarettes  . Smokeless tobacco: Never Used  Substance Use Topics  . Alcohol use: No  . Drug use: No    Review of Systems Constitutional: No fever/chills Eyes: No visual changes. ENT: No sore throat. Cardiovascular: Denies chest pain. Respiratory: Positive for shortness of breath. Gastrointestinal: No abdominal pain.  No nausea, no vomiting.  No diarrhea.  No constipation. Genitourinary: Negative for dysuria. Musculoskeletal: Negative for back pain. Skin: Negative for rash. Neurological: Negative for headaches, focal weakness or numbness.   ____________________________________________   PHYSICAL EXAM:  VITAL SIGNS: ED Triage Vitals  Enc Vitals Group     BP 09/28/17 2149 (!) 95/40     Pulse Rate 09/28/17 2149 83     Resp 09/28/17 2149 20     Temp 09/28/17 2149 98.1 F (36.7 C)     Temp src --      SpO2 09/28/17 2149 95 %     Weight 09/28/17 2150 145 lb (65.8 kg)     Height --      Head Circumference --      Peak Flow --      Pain Score --      Pain Loc --      Pain Edu? --      Excl. in GC? --     Constitutional: Pleasant cooperative no acute distress Eyes: PERRL EOMI. Head: Atraumatic. Nose: No congestion/rhinnorhea. Mouth/Throat: No trismus Neck: No stridor.   Cardiovascular: Normal rate, regular rhythm. Grossly normal heart sounds.  Good peripheral circulation. Respiratory: Normal respiratory effort.  No retractions. Lungs CTAB and moving good air Gastrointestinal: Soft nontender Musculoskeletal: No lower extremity edema   Neurologic:  Normal speech and language. No gross focal neurologic deficits are appreciated. Skin:  Skin is warm, dry and intact. No rash noted. Psychiatric: Mood and affect are normal. Speech and behavior are normal.    ____________________________________________   DIFFERENTIAL includes but not limited  to  COPD exacerbation, pneumonia, pneumothorax, acute coronary syndrome, pulmonary ____________________________________________   LABS (all labs ordered are listed, but only abnormal results are displayed)  Labs Reviewed  GLUCOSE, CAPILLARY - Abnormal; Notable for the following components:      Result Value   Glucose-Capillary 117 (*)    All other components within normal limits  BLOOD GAS, VENOUS - Abnormal; Notable for the following components:   pCO2, Ven 38 (*)    pO2, Ven 54.0 (*)    Acid-base deficit 2.9 (*)    All other components within normal limits  COMPREHENSIVE METABOLIC PANEL - Abnormal; Notable for the following components:   CO2 20 (*)    Glucose, Bld 123 (*)    BUN 34 (*)    Creatinine, Ser 1.67 (*)    ALT 16 (*)  GFR calc non Af Amer 38 (*)    GFR calc Af Amer 44 (*)    All other components within normal limits  CBC WITH DIFFERENTIAL/PLATELET - Abnormal; Notable for the following components:   WBC 11.7 (*)    RBC 4.15 (*)    Hemoglobin 12.8 (*)    HCT 36.1 (*)    RDW 15.4 (*)    Platelets 131 (*)    Neutro Abs 7.8 (*)    All other components within normal limits  GLUCOSE, CAPILLARY - Abnormal; Notable for the following components:   Glucose-Capillary 125 (*)    All other components within normal limits  TROPONIN I  URINALYSIS, COMPLETE (UACMP) WITH MICROSCOPIC  BRAIN NATRIURETIC PEPTIDE    Lab work reviewed by me with no signs of acute ischemia __________________________________________  EKG  ED ECG REPORT I, Merrily Brittle, the attending physician, personally viewed and interpreted this ECG.  Date: 09/28/2017 EKG Time:  Rate: 70 Rhythm: normal sinus rhythm QRS Axis: normal Intervals: normal ST/T Wave abnormalities: T wave inversion aVL consistent with old Narrative Interpretation: no evidence of acute ischemia  ____________________________________________  RADIOLOGY  Chest x-ray reviewed by me with no acute  disease ____________________________________________   PROCEDURES  Procedure(s) performed: no  Procedures  Critical Care performed: no  Observation: no ____________________________________________   INITIAL IMPRESSION / ASSESSMENT AND PLAN / ED COURSE  Pertinent labs & imaging results that were available during my care of the patient were reviewed by me and considered in my medical decision making (see chart for details).  Patient arrives saturating 95% on room air.  Lungs are clear and do not require breathing treatment.  Given his age and comorbidities I do think is reasonable to check an EKG and a troponin and fortunately these are also reassuring.  The patient would like to go home.  Strict return precautions have been given and the patient verbalizes understanding and agree with plan.      ____________________________________________   FINAL CLINICAL IMPRESSION(S) / ED DIAGNOSES  Final diagnoses:  Generalized weakness  Dehydration      NEW MEDICATIONS STARTED DURING THIS VISIT:  New Prescriptions   No medications on file     Note:  This document was prepared using Dragon voice recognition software and may include unintentional dictation errors.     Merrily Brittle, MD 09/28/17 [phone]

## 2017-09-29 LAB — BRAIN NATRIURETIC PEPTIDE: B Natriuretic Peptide: 42 pg/mL (ref 0.0–100.0)

## 2017-10-03 ENCOUNTER — Emergency Department
Admission: EM | Admit: 2017-10-03 | Discharge: 2017-10-03 | Disposition: A | Discharge status: Skilled Nursing Facility | Payer: Medicare Other | Attending: Emergency Medicine | Admitting: Emergency Medicine

## 2017-10-03 ENCOUNTER — Emergency Department: Payer: Medicare Other

## 2017-10-03 DIAGNOSIS — J449 Chronic obstructive pulmonary disease, unspecified: Secondary | ICD-10-CM | POA: Diagnosis not present

## 2017-10-03 DIAGNOSIS — Z96642 Presence of left artificial hip joint: Secondary | ICD-10-CM | POA: Insufficient documentation

## 2017-10-03 DIAGNOSIS — W19XXXA Unspecified fall, initial encounter: Secondary | ICD-10-CM

## 2017-10-03 DIAGNOSIS — W01198A Fall on same level from slipping, tripping and stumbling with subsequent striking against other object, initial encounter: Secondary | ICD-10-CM | POA: Diagnosis not present

## 2017-10-03 DIAGNOSIS — I10 Essential (primary) hypertension: Secondary | ICD-10-CM | POA: Diagnosis not present

## 2017-10-03 DIAGNOSIS — F17210 Nicotine dependence, cigarettes, uncomplicated: Secondary | ICD-10-CM | POA: Insufficient documentation

## 2017-10-03 DIAGNOSIS — I259 Chronic ischemic heart disease, unspecified: Secondary | ICD-10-CM | POA: Diagnosis not present

## 2017-10-03 DIAGNOSIS — M25552 Pain in left hip: Secondary | ICD-10-CM | POA: Insufficient documentation

## 2017-10-03 LAB — CBC WITH DIFFERENTIAL/PLATELET
BASOS ABS: 0 10*3/uL (ref 0–0.1)
Basophils Relative: 0 %
EOS PCT: 3 %
Eosinophils Absolute: 0.2 10*3/uL (ref 0–0.7)
HCT: 36.7 % — ABNORMAL LOW (ref 40.0–52.0)
HEMOGLOBIN: 12.7 g/dL — AB (ref 13.0–18.0)
LYMPHS PCT: 31 %
Lymphs Abs: 2.8 10*3/uL (ref 1.0–3.6)
MCH: 30.3 pg (ref 26.0–34.0)
MCHC: 34.7 g/dL (ref 32.0–36.0)
MCV: 87.5 fL (ref 80.0–100.0)
Monocytes Absolute: 0.5 10*3/uL (ref 0.2–1.0)
Monocytes Relative: 6 %
NEUTROS PCT: 60 %
Neutro Abs: 5.3 10*3/uL (ref 1.4–6.5)
Platelets: 132 10*3/uL — ABNORMAL LOW (ref 150–440)
RBC: 4.19 MIL/uL — AB (ref 4.40–5.90)
RDW: 15.4 % — ABNORMAL HIGH (ref 11.5–14.5)
WBC: 8.9 10*3/uL (ref 3.8–10.6)

## 2017-10-03 LAB — COMPREHENSIVE METABOLIC PANEL
ALBUMIN: 3.8 g/dL (ref 3.5–5.0)
ALT: 16 U/L — ABNORMAL LOW (ref 17–63)
ANION GAP: 8 (ref 5–15)
AST: 24 U/L (ref 15–41)
Alkaline Phosphatase: 78 U/L (ref 38–126)
BUN: 43 mg/dL — ABNORMAL HIGH (ref 6–20)
CALCIUM: 8.9 mg/dL (ref 8.9–10.3)
CO2: 22 mmol/L (ref 22–32)
Chloride: 107 mmol/L (ref 101–111)
Creatinine, Ser: 1.65 mg/dL — ABNORMAL HIGH (ref 0.61–1.24)
GFR calc Af Amer: 44 mL/min — ABNORMAL LOW (ref >60)
GFR calc non Af Amer: 38 mL/min — ABNORMAL LOW (ref >60)
GLUCOSE: 104 mg/dL — AB (ref 65–99)
Potassium: 5 mmol/L (ref 3.5–5.1)
SODIUM: 137 mmol/L (ref 135–145)
Total Bilirubin: 0.8 mg/dL (ref 0.3–1.2)
Total Protein: 6.9 g/dL (ref 6.5–8.1)

## 2017-10-03 LAB — TROPONIN I: Troponin I: <0.03 ng/mL (ref <0.03)

## 2017-10-03 MED ORDER — ONDANSETRON HCL 4 MG/2ML IJ SOLN
4.0000 mg | Freq: Once | INTRAMUSCULAR | Status: AC
  Administered 2017-10-03: 4 mg via INTRAVENOUS
  Filled 2017-10-03: qty 2

## 2017-10-03 MED ORDER — LIDOCAINE 5 % EX PTCH
1.0000 | MEDICATED_PATCH | CUTANEOUS | Status: DC
  Administered 2017-10-03: 1 via TRANSDERMAL
  Filled 2017-10-03: qty 1

## 2017-10-03 MED ORDER — MORPHINE SULFATE (PF) 4 MG/ML IV SOLN
4.0000 mg | Freq: Once | INTRAVENOUS | Status: AC
  Administered 2017-10-03: 4 mg via INTRAVENOUS
  Filled 2017-10-03: qty 1

## 2017-10-03 MED ORDER — LIDOCAINE 5 % EX PTCH: 1.0000 | MEDICATED_PATCH | Freq: Two times a day (BID) | CUTANEOUS | 0 refills | Status: AC

## 2017-10-03 NOTE — ED Notes (Signed)
Pt to xray at this time.

## 2017-10-03 NOTE — ED Notes (Signed)
Pt back from x-ray.

## 2017-10-03 NOTE — ED Notes (Signed)
Pt said that he missed dinner at SNF so this RN provided him with [employer] sandwich box and ginger ale. He was appreciative of this. Denies further needs.

## 2017-10-03 NOTE — ED Notes (Signed)
[facility] transport arranged for pt back to facility. Report called back to facility.

## 2017-10-03 NOTE — ED Notes (Signed)
Called pt's social worker at [facility] and made her aware that he is in the ED. She provided his daughter's contact info. Will call [facility] and pt's daughter to set up transport for him back to facility.

## 2017-10-03 NOTE — ED Provider Notes (Signed)
Patient received in sign-out from Dr. Mayford KnifeWilliams.  Workup and evaluation pending radiographs are assessment.  Patient able to ambulate with no assistance.  No evidence of fracture.  No evidence of dislocation.  Neurovascular intact.  Blood work is otherwise reassuring.  Patient stable and appropriate for discharge home.      Willy Eddyobinson, Kelani Robart, MD 10/03/17 1640

## 2017-10-03 NOTE — ED Triage Notes (Signed)
Pt presents today via [facility]. Pt states he went unconscious and pt is a/o x4. Pt has hx of l hip replacement. EDP at bedside.

## 2017-10-03 NOTE — ED Notes (Signed)
Pt ambulated in room with pain to L hip but he was able to move extremity. PMN intact in leg.

## 2017-10-03 NOTE — ED Notes (Signed)
This RN is able to palpate pulse on the Left foot. No rotation or shortening noted at this time.

## 2017-10-03 NOTE — ED Provider Notes (Signed)
[facility] Emergency Department Provider Note       Time seen: ----------------------------------------- 2:45 PM on 10/03/2017 -----------------------------------------   I have reviewed the triage vital signs and the nursing notes.  HISTORY   Chief Complaint Fall    HPI William Wilcox is a 77 y.o. male with a history of COPD, coronary disease, hypertension and previous left hip replacement who presents to the ED for a fall.  Patient [place]  Patient reportedly tripped and fell and landed on the left hip.  Patient is complaining of severe left hip pain, again with a history of left hip replacement.  He denies any other injuries or complaints.  Past Medical History:  Diagnosis Date  . COPD (chronic obstructive pulmonary disease) (HCC)   . Coronary artery disease   . Hypertension     Patient Active Problem List   Diagnosis Date Noted  . Leg pain 02/09/2017  . Lower extremity pain, bilateral 10/04/2016  . Dyslipidemia 04/14/2015  . Tobacco use disorder 04/14/2015  . 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 Major depressive disorder, single episode, severe without psychotic features (HCC) 04/13/2015  . COPD (chronic obstructive pulmonary disease) (HCC) 04/13/2015  . Chronic hip pain 04/13/2015  . Hypertension 04/13/2015    Past Surgical History:  Procedure Laterality Date  . BACK SURGERY    . CARDIAC SURGERY     triple bypass  . HIP SURGERY Left     Allergies Patient has no known allergies.  Social History Social History   Tobacco Use  . Smoking status: Current Every Day Smoker    Packs/day: 2.00    Years: 57.00    Pack years: 114.00    Types: Cigarettes  . Smokeless tobacco: Never Used  Substance Use Topics  . Alcohol use: No  . Drug use: No   Review of Systems Constitutional: Negative for fever. Cardiovascular: Negative for chest pain. Respiratory:  Negative for shortness of breath. Gastrointestinal: Negative for abdominal pain, vomiting and diarrhea. Musculoskeletal: Positive for left hip pain Skin: Negative for rash. Neurological: Negative for headaches, focal weakness or numbness.  All systems negative/normal/unremarkable except as stated in the HPI  ____________________________________________   PHYSICAL EXAM:  VITAL SIGNS: ED Triage Vitals  Enc Vitals Group     BP 10/03/17 1435 106/82     Pulse Rate 10/03/17 1435 62     Resp --      Temp 10/03/17 1435 97.8 F (36.6 C)     Temp Source 10/03/17 1435 Oral     SpO2 10/03/17 1435 98 %     Weight 10/03/17 1436 145 lb (65.8 kg)     Height 10/03/17 1436 5\' 3"  (1.6 m)     Head Circumference --      Peak Flow --      Pain Score 10/03/17 1436 5     Pain Loc --      Pain Edu? --      Excl. in GC? --    Constitutional: Alert and oriented. Well appearing and in no distress. Eyes: Conjunctivae are normal. Normal extraocular movements. ENT   Head: Normocephalic and atraumatic.   Nose: No congestion/rhinnorhea.   Mouth/Throat: Mucous membranes are moist.   Neck: No stridor. Cardiovascular: Normal rate, regular rhythm. No murmurs, rubs, or gallops. Respiratory: Normal respiratory effort without tachypnea nor retractions. Breath sounds are clear and equal bilaterally. No wheezes/rales/rhonchi. Gastrointestinal: Soft and nontender. Normal bowel sounds Musculoskeletal:  Severe pain with range of motion of the left hip, no shortening or rotation.  Normal pulses Neurologic:  Normal speech and language. No gross focal neurologic deficits are appreciated.  Skin:  Skin is warm, dry and intact. No rash noted. Psychiatric: Mood and affect are normal. Speech and behavior are normal.  ____________________________________________  EKG: Interpreted by me.  Sinus rhythm rate 61 bpm, normal PR interval, normal QRS size, normal QT, nonspecific ST segment  changes  ____________________________________________  ED COURSE:  As part of my medical decision making, I reviewed the following data within the electronic MEDICAL RECORD NUMBER History obtained from family if available, nursing notes, old chart and ekg, as well as notes from prior ED visits. Patient presented for a mechanical fall, we will assess with labs and imaging as indicated at this time.   Procedures ____________________________________________   LABS (pertinent positives/negatives)  Labs Reviewed  CBC WITH DIFFERENTIAL/PLATELET - Abnormal; Notable for the following components:      Result Value   RBC 4.19 (*)    Hemoglobin 12.7 (*)    HCT 36.7 (*)    RDW 15.4 (*)    Platelets 132 (*)    All other components within normal limits  COMPREHENSIVE METABOLIC PANEL - Abnormal; Notable for the following components:   Glucose, Bld 104 (*)    BUN 43 (*)    Creatinine, Ser 1.65 (*)    ALT 16 (*)    GFR calc non Af Amer 38 (*)    GFR calc Af Amer 44 (*)    All other components within normal limits  TROPONIN I    RADIOLOGY  Left hip x-rays Are pending at this time ____________________________________________  DIFFERENTIAL DIAGNOSIS   Fracture, dislocation unlikely, contusion  FINAL ASSESSMENT AND PLAN  Fall, left hip pain   Plan: The patient had presented for a mechanical fall with left hip pain. Patient's labs are pending Patient care checked out to Dr. Roxan Hockeyobinson for final disposition   Ulice DashJohnathan E Williams, MD   Note: This note was generated in part or whole with voice recognition software. Voice recognition is usually quite accurate but there are transcription errors that can and very often do occur. I apologize for any typographical errors that were not detected and corrected.     Emily FilbertWilliams, Jonathan E, MD 10/04/17 1504

## 2017-10-03 NOTE — ED Notes (Signed)
Called [facility] and they stated that their transport service left at 3 pm so no one is able to pick pt up. Called pt's daughter's phone number and it gives the busy signal.

## 2017-10-10 ENCOUNTER — Encounter: Payer: Self-pay | Admitting: Emergency Medicine

## 2017-10-10 ENCOUNTER — Emergency Department
Admission: EM | Admit: 2017-10-10 | Discharge: 2017-10-11 | Disposition: A | Discharge status: Nursing Home (Includes ICF) | Payer: Medicare Other | Attending: Emergency Medicine | Admitting: Emergency Medicine

## 2017-10-10 ENCOUNTER — Other Ambulatory Visit: Payer: Self-pay

## 2017-10-10 ENCOUNTER — Emergency Department: Payer: Medicare Other

## 2017-10-10 DIAGNOSIS — H5789 Other specified disorders of eye and adnexa: Secondary | ICD-10-CM | POA: Diagnosis not present

## 2017-10-10 DIAGNOSIS — F17210 Nicotine dependence, cigarettes, uncomplicated: Secondary | ICD-10-CM | POA: Diagnosis not present

## 2017-10-10 DIAGNOSIS — H5712 Ocular pain, left eye: Secondary | ICD-10-CM | POA: Diagnosis present

## 2017-10-10 DIAGNOSIS — H538 Other visual disturbances: Secondary | ICD-10-CM | POA: Diagnosis not present

## 2017-10-10 DIAGNOSIS — I10 Essential (primary) hypertension: Secondary | ICD-10-CM | POA: Insufficient documentation

## 2017-10-10 DIAGNOSIS — I2510 Atherosclerotic heart disease of native coronary artery without angina pectoris: Secondary | ICD-10-CM | POA: Insufficient documentation

## 2017-10-10 DIAGNOSIS — J449 Chronic obstructive pulmonary disease, unspecified: Secondary | ICD-10-CM | POA: Diagnosis not present

## 2017-10-10 DIAGNOSIS — Z79899 Other long term (current) drug therapy: Secondary | ICD-10-CM | POA: Diagnosis not present

## 2017-10-10 MED ORDER — TETRACAINE HCL 0.5 % OP SOLN
OPHTHALMIC | Status: AC
  Filled 2017-10-10: qty 4

## 2017-10-10 MED ORDER — FLUORESCEIN SODIUM 1 MG OP STRP
ORAL_STRIP | OPHTHALMIC | Status: AC
  Filled 2017-10-10: qty 1

## 2017-10-10 NOTE — ED Provider Notes (Signed)
[facility] Emergency Department Provider Note   ____________________________________________   First MD Initiated Contact with Patient 10/10/17 2308     (approximate)  I have reviewed the triage vital signs and the nursing notes.   HISTORY  Chief Complaint Eye Problem    HPI William Wilcox is a 77 y.o. male who comes into the hospital today with some left-sided visual changes.  The patient states that today he started having some vision loss on the left side.  He reports that his vision was boggy and became worse and worse throughout the day.  He also states that he has been having some ringing in his right ear and pain in his right ear for the past 2 to 3 days.  The patient states that he could not see anything from his left eye today and has been rubbing it with a concern that there was something in his eye.  He reports that as he was coming here it seems to have improved slightly but he still cannot see much on the left.  The patient reports that his hip is also been acting up and he has been feeling weak all over his body.  The patient rates his discomfort and pain a 10 out of 10 in intensity.  He reports that he is frustrated with his nursing home.  He was also supposed to be getting glasses from the [place] but they were never sent.  The patient has been feeling wobbly all over.  He denies any chest pain or shortness of breath.  He denies any headache.  He is here today for evaluation of his symptoms.   Past Medical History:  Diagnosis Date  . COPD (chronic obstructive pulmonary disease) (HCC)   . Coronary artery disease   . Hypertension     Patient Active Problem List   Diagnosis Date Noted  . Leg pain 02/09/2017  . Lower extremity pain, bilateral 10/04/2016  . Dyslipidemia 04/14/2015  . Tobacco use disorder 04/14/2015  . 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 Major depressive disorder, single  episode, severe without psychotic features (HCC) 04/13/2015  . COPD (chronic obstructive pulmonary disease) (HCC) 04/13/2015  . Chronic hip pain 04/13/2015  . Hypertension 04/13/2015    Past Surgical History:  Procedure Laterality Date  . BACK SURGERY    . CARDIAC SURGERY     triple bypass  . HIP SURGERY Left     Prior to Admission medications   Medication Sig Start Date End Date Taking? Authorizing Provider  acetaminophen (TYLENOL) 325 MG tablet Take 650 mg by mouth.    [provider]  albuterol (PROVENTIL HFA;VENTOLIN HFA) 108 (90 Base) MCG/ACT inhaler Inhale into the lungs.    [provider]  ammonium lactate (LAC-HYDRIN) 12 % lotion Apply 1 application topically as needed for dry skin.    [provider]  diazepam (VALIUM) 5 MG tablet Take 1 tablet (5 mg total) by mouth every 8 (eight) hours as needed for muscle spasms. 07/21/17   Emily Filbert, MD  doxycycline (VIBRAMYCIN) 100 MG capsule Take 100 mg by mouth.    [provider]  DULoxetine (CYMBALTA) 30 MG capsule Take 30 mg by mouth daily.    [provider]  erythromycin ophthalmic ointment Place 1 application into the left eye 3 (three) times daily for 5 days. 10/11/17 10/16/17  Rebecka Apley, MD  gabapentin (NEURONTIN) 100 MG capsule Take 100 mg by mouth.  [provider]  guaiFENesin-dextromethorphan (ROBITUSSIN DM) 100-10 MG/5ML syrup Take 5 mLs by mouth.    [provider]  ibuprofen (ADVIL,MOTRIN) 400 MG tablet Take 1 tablet (400 mg total) by mouth every 8 (eight) hours as needed. 07/21/17   Emily Filbert, MD  ibuprofen (ADVIL,MOTRIN) 600 MG tablet Take 600 mg by mouth. 10/23/15   [provider]  ipratropium-albuterol (DUONEB) 0.5-2.5 (3) MG/3ML SOLN Take 3 mLs by nebulization every 4 (four) hours as needed.    [provider]  lidocaine (LIDODERM) 5 % Place 1 patch onto the skin every 12 (twelve) hours. Remove & Discard patch  within 12 hours or as directed by MD 10/03/17 10/03/18  Willy Eddy, MD  lisinopril (PRINIVIL,ZESTRIL) 40 MG tablet Take 40 mg by mouth daily.    [provider]  loratadine (CLARITIN) 10 MG tablet Take 10 mg by mouth daily.    [provider]  predniSONE (DELTASONE) 20 MG tablet Take 20 mg by mouth.    [provider]  simvastatin (ZOCOR) 80 MG tablet Take 80 mg by mouth every evening.    [provider]  triamcinolone cream (KENALOG) 0.1 % Apply 1 application topically.    [provider]    Allergies Patient has no known allergies.  No family history on file.  Social History Social History   Tobacco Use  . Smoking status: Current Every Day Smoker    Packs/day: 2.00    Years: 57.00    Pack years: 114.00    Types: Cigarettes  . Smokeless tobacco: Never Used  Substance Use Topics  . Alcohol use: No  . Drug use: No    Review of Systems  Constitutional: No fever/chills Eyes: Left-sided blurred vision ENT: Right sided tinnitus Cardiovascular: Denies chest pain. Respiratory: Denies shortness of breath. Gastrointestinal: No abdominal pain.  No nausea, no vomiting.   Genitourinary: Negative for dysuria. Musculoskeletal: Negative for back pain. Skin: Negative for rash. Neurological: Generalized weakness with negative for headaches, focal weakness or numbness.   ____________________________________________   PHYSICAL EXAM:  VITAL SIGNS: ED Triage Vitals  Enc Vitals Group     BP --      Pulse Rate 10/10/17 2305 87     Resp 10/10/17 2305 17     Temp 10/10/17 2305 98.1 F (36.7 C)     Temp src --      SpO2 10/10/17 2241 96 %     Weight --      Height --      Head Circumference --      Peak Flow --      Pain Score --      Pain Loc --      Pain Edu? --      Excl. in GC? --     Constitutional: Alert and oriented. Well appearing and in mild distress. Eyes: Conjunctivae are normal. PERRL. EOMI. Head:  Atraumatic. Nose: No congestion/rhinnorhea. Mouth/Throat: Mucous membranes are moist.  Oropharynx non-erythematous. Cardiovascular: Normal rate, regular rhythm. Grossly normal heart sounds.  Good peripheral circulation. Respiratory: Normal respiratory effort.  No retractions. Lungs CTAB. Gastrointestinal: Soft and nontender. No distention.  Positive bowel sounds Musculoskeletal: No lower extremity tenderness nor edema.   Neurologic:  Normal speech and language.  Skin:  Skin is warm, dry and intact.  Psychiatric: Mood and affect are normal.   ____________________________________________   LABS (all labs ordered are listed, but only abnormal results are displayed)  Labs Reviewed  CBC - Abnormal; Notable for  the following components:      Result Value   RBC 3.92 (*)    Hemoglobin 12.1 (*)    HCT 34.4 (*)    RDW 15.3 (*)    Platelets 123 (*)    All other components within normal limits  BASIC METABOLIC PANEL - Abnormal; Notable for the following components:   BUN 27 (*)    Calcium 8.7 (*)    GFR calc non Af Amer 59 (*)    All other components within normal limits  URINALYSIS, COMPLETE (UACMP) WITH MICROSCOPIC - Abnormal; Notable for the following components:   Color, Urine YELLOW (*)    APPearance CLEAR (*)    All other components within normal limits   ____________________________________________  EKG  none ____________________________________________  RADIOLOGY  ED MD interpretation:  CT head: No acute intracranial hemorrhage, age-related atrophy and chronic microvascular ischemic changes.  Official radiology report(s): Ct Head Wo Contrast  Result Date: 10/11/2017 CLINICAL DATA:  77 year old male with visual loss and left eye blurriness. EXAM: CT HEAD WITHOUT CONTRAST TECHNIQUE: Contiguous axial images were obtained from the base of the skull through the vertex without intravenous contrast. COMPARISON:  None. FINDINGS: Brain: There is mild age-related atrophy and  moderate chronic microvascular ischemic changes. There is no acute intracranial hemorrhage. No mass effect or midline shift. No extra-axial fluid collection. Vascular: No hyperdense vessel or unexpected calcification. Skull: Normal. Negative for fracture or focal lesion. Sinuses/Orbits: Mild mucoperiosteal thickening of paranasal sinuses. No air-fluid levels. The mastoid air cells are clear. Other: None IMPRESSION: 1. No acute intracranial hemorrhage. 2. Age-related atrophy and chronic microvascular ischemic changes. Electronically Signed   By: Elgie CollardArash  Radparvar M.D.   On: 10/11/2017 00:49    ____________________________________________   PROCEDURES  Procedure(s) performed: None  Procedures  Critical Care performed: No  ____________________________________________   INITIAL IMPRESSION / ASSESSMENT AND PLAN / ED COURSE  As part of my medical decision making, I reviewed the following data within the electronic MEDICAL RECORD NUMBER Notes from prior ED visits and [place] Controlled Substance Database   This is a 77 year old male who comes into the hospital today with some right-sided blurred vision and ringing in his ear.  The patient has been having the symptoms all day.  My differential diagnosis includes  CVA, optic neuritis, scleritis, electrolyte disturbance  I will send the patient for a CT scan looking for possible CVA given his symptoms.  I will also check a CBC, BMP and a urinalysis on the patient.  He will be reassessed.  The patient CT head is unremarkable.  I did give the patient some erythromycin eye ointment as well as a dose of dexamethasone.  I contacted Dr. Inez PilgrimBrasington to determine if there was any other thought or suggestion in regards to the patient's blurred vision.  He reports that it seems as though we have checked for everything emergent and he would like to see the patient in his office.  The patient is able to count fingers with his left eye but he is unable to read from far  away.  The patient will be discharged back to his nursing home and encouraged to follow-up with ophthalmology within 1 to 2 days.      ____________________________________________   FINAL CLINICAL IMPRESSION(S) / ED DIAGNOSES  Final diagnoses:  Blurred vision, left eye  Eye irritation     ED Discharge Orders        Ordered    erythromycin ophthalmic ointment  3 times daily  10/11/17 0232       Note:  This document was prepared using Dragon voice recognition software and may include unintentional dictation errors.    Rebecka Apley, MD 10/11/17 [phone]

## 2017-10-10 NOTE — ED Triage Notes (Addendum)
Pt bib [facility] from [facility] [place] for c/o left eye blurriness. Pt states it is worse during the day than at night "it just gets foggy". Denies any other neuro deficits, reports cataract surgery but unsure how long ago

## 2017-10-11 DIAGNOSIS — H5789 Other specified disorders of eye and adnexa: Secondary | ICD-10-CM | POA: Diagnosis not present

## 2017-10-11 LAB — BASIC METABOLIC PANEL
Anion gap: 6 (ref 5–15)
BUN: 27 mg/dL — ABNORMAL HIGH (ref 8–23)
CO2: 23 mmol/L (ref 22–32)
Calcium: 8.7 mg/dL — ABNORMAL LOW (ref 8.9–10.3)
Chloride: 111 mmol/L (ref 98–111)
Creatinine, Ser: 1.15 mg/dL (ref 0.61–1.24)
GFR calc Af Amer: >60 mL/min (ref >60)
GFR calc non Af Amer: 59 mL/min — ABNORMAL LOW (ref >60)
Glucose, Bld: 99 mg/dL (ref 70–99)
Potassium: 4.4 mmol/L (ref 3.5–5.1)
Sodium: 140 mmol/L (ref 135–145)

## 2017-10-11 LAB — URINALYSIS, COMPLETE (UACMP) WITH MICROSCOPIC
Bacteria, UA: NONE SEEN
Bilirubin Urine: NEGATIVE
GLUCOSE, UA: NEGATIVE mg/dL
Hgb urine dipstick: NEGATIVE
KETONES UR: NEGATIVE mg/dL
Leukocytes, UA: NEGATIVE
NITRITE: NEGATIVE
Protein, ur: NEGATIVE mg/dL
Specific Gravity, Urine: 1.018 (ref 1.005–1.030)
Squamous Epithelial / LPF: NONE SEEN (ref 0–5)
pH: 5 (ref 5.0–8.0)

## 2017-10-11 LAB — CBC
HEMATOCRIT: 34.4 % — AB (ref 40.0–52.0)
HEMOGLOBIN: 12.1 g/dL — AB (ref 13.0–18.0)
MCH: 30.9 pg (ref 26.0–34.0)
MCHC: 35.3 g/dL (ref 32.0–36.0)
MCV: 87.6 fL (ref 80.0–100.0)
Platelets: 123 10*3/uL — ABNORMAL LOW (ref 150–440)
RBC: 3.92 MIL/uL — ABNORMAL LOW (ref 4.40–5.90)
RDW: 15.3 % — ABNORMAL HIGH (ref 11.5–14.5)
WBC: 10.3 10*3/uL (ref 3.8–10.6)

## 2017-10-11 MED ORDER — ERYTHROMYCIN 5 MG/GM OP OINT
TOPICAL_OINTMENT | Freq: Once | OPHTHALMIC | Status: AC
  Administered 2017-10-11: 03:00:00 via OPHTHALMIC
  Filled 2017-10-11: qty 1

## 2017-10-11 MED ORDER — DEXAMETHASONE SODIUM PHOSPHATE 10 MG/ML IJ SOLN
10.0000 mg | Freq: Once | INTRAMUSCULAR | Status: AC
  Administered 2017-10-11: 10 mg via INTRAVENOUS
  Filled 2017-10-11: qty 1

## 2017-10-11 MED ORDER — ERYTHROMYCIN 5 MG/GM OP OINT: 1.0000 "application " | TOPICAL_OINTMENT | Freq: Three times a day (TID) | OPHTHALMIC | 0 refills | Status: AC

## 2017-10-11 NOTE — ED Notes (Signed)
[phone]. Unable to reach staff, no other options, phone automatically disconnects.

## 2017-10-11 NOTE — ED Notes (Signed)
Pt. Verbalizes understanding of d/c instructions, medications, and follow-up. VS stable and pain controlled per pt.  Pt. In NAD at time of d/c and denies further concerns regarding this visit. Pt. Stable at the time of departure from the unit, departing unit by the safest and most appropriate manner per that pt condition and limitations with all belongings accounted for with [facility]. Pt advised to return to the ED at any time for emergent concerns, or for new/worsening symptoms.

## 2017-10-11 NOTE — ED Notes (Signed)
Patient transported to CT 

## 2017-10-11 NOTE — Discharge Instructions (Signed)
Please follow-up with the ophthalmologist for further evaluation of your posterior eye and your blurred vision.  Please return with any eye pain, eye drainage or any other concerns.

## 2017-10-11 NOTE — ED Notes (Signed)
Pt. Returned to tx. room in stable condition with no acute changes since departure from unit for scans.   

## 2019-02-01 ENCOUNTER — Emergency Department: Payer: Medicare Other

## 2019-02-01 ENCOUNTER — Other Ambulatory Visit: Payer: Self-pay

## 2019-02-01 ENCOUNTER — Observation Stay
Admission: EM | Admit: 2019-02-01 | Discharge: 2019-02-02 | Payer: Medicare Other | Attending: Internal Medicine | Admitting: Internal Medicine

## 2019-02-01 DIAGNOSIS — D649 Anemia, unspecified: Secondary | ICD-10-CM | POA: Diagnosis not present

## 2019-02-01 DIAGNOSIS — I10 Essential (primary) hypertension: Secondary | ICD-10-CM | POA: Insufficient documentation

## 2019-02-01 DIAGNOSIS — Z20828 Contact with and (suspected) exposure to other viral communicable diseases: Secondary | ICD-10-CM | POA: Insufficient documentation

## 2019-02-01 DIAGNOSIS — Z79899 Other long term (current) drug therapy: Secondary | ICD-10-CM | POA: Insufficient documentation

## 2019-02-01 DIAGNOSIS — F17210 Nicotine dependence, cigarettes, uncomplicated: Secondary | ICD-10-CM | POA: Diagnosis not present

## 2019-02-01 DIAGNOSIS — I129 Hypertensive chronic kidney disease with stage 1 through stage 4 chronic kidney disease, or unspecified chronic kidney disease: Secondary | ICD-10-CM | POA: Diagnosis not present

## 2019-02-01 DIAGNOSIS — R079 Chest pain, unspecified: Secondary | ICD-10-CM | POA: Diagnosis present

## 2019-02-01 DIAGNOSIS — J449 Chronic obstructive pulmonary disease, unspecified: Secondary | ICD-10-CM | POA: Insufficient documentation

## 2019-02-01 DIAGNOSIS — I2510 Atherosclerotic heart disease of native coronary artery without angina pectoris: Secondary | ICD-10-CM | POA: Diagnosis not present

## 2019-02-01 DIAGNOSIS — Z7952 Long term (current) use of systemic steroids: Secondary | ICD-10-CM | POA: Insufficient documentation

## 2019-02-01 DIAGNOSIS — E875 Hyperkalemia: Secondary | ICD-10-CM | POA: Diagnosis not present

## 2019-02-01 DIAGNOSIS — F419 Anxiety disorder, unspecified: Secondary | ICD-10-CM | POA: Diagnosis not present

## 2019-02-01 DIAGNOSIS — F329 Major depressive disorder, single episode, unspecified: Secondary | ICD-10-CM | POA: Insufficient documentation

## 2019-02-01 DIAGNOSIS — Z951 Presence of aortocoronary bypass graft: Secondary | ICD-10-CM | POA: Insufficient documentation

## 2019-02-01 DIAGNOSIS — N183 Chronic kidney disease, stage 3 unspecified: Secondary | ICD-10-CM | POA: Diagnosis not present

## 2019-02-01 DIAGNOSIS — E785 Hyperlipidemia, unspecified: Secondary | ICD-10-CM | POA: Insufficient documentation

## 2019-02-01 DIAGNOSIS — Z791 Long term (current) use of non-steroidal anti-inflammatories (NSAID): Secondary | ICD-10-CM | POA: Insufficient documentation

## 2019-02-01 DIAGNOSIS — I340 Nonrheumatic mitral (valve) insufficiency: Secondary | ICD-10-CM | POA: Insufficient documentation

## 2019-02-01 DIAGNOSIS — I252 Old myocardial infarction: Secondary | ICD-10-CM | POA: Insufficient documentation

## 2019-02-01 DIAGNOSIS — J810 Acute pulmonary edema: Secondary | ICD-10-CM | POA: Insufficient documentation

## 2019-02-01 HISTORY — DX: Acute myocardial infarction, unspecified: I21.9

## 2019-02-01 LAB — CBC WITH DIFFERENTIAL/PLATELET
Abs Immature Granulocytes: 0.15 10*3/uL — ABNORMAL HIGH (ref 0.00–0.07)
Basophils Absolute: 0 10*3/uL (ref 0.0–0.1)
Basophils Relative: 0 %
Eosinophils Absolute: 0.5 10*3/uL (ref 0.0–0.5)
Eosinophils Relative: 3 %
HCT: 32.8 % — ABNORMAL LOW (ref 39.0–52.0)
Hemoglobin: 10.9 g/dL — ABNORMAL LOW (ref 13.0–17.0)
Immature Granulocytes: 1 %
Lymphocytes Relative: 35 %
Lymphs Abs: 5.2 10*3/uL — ABNORMAL HIGH (ref 0.7–4.0)
MCH: 30.8 pg (ref 26.0–34.0)
MCHC: 33.2 g/dL (ref 30.0–36.0)
MCV: 92.7 fL (ref 80.0–100.0)
Monocytes Absolute: 0.9 10*3/uL (ref 0.1–1.0)
Monocytes Relative: 6 %
Neutro Abs: 8 10*3/uL — ABNORMAL HIGH (ref 1.7–7.7)
Neutrophils Relative %: 55 %
Platelets: 154 10*3/uL (ref 150–400)
RBC: 3.54 MIL/uL — ABNORMAL LOW (ref 4.22–5.81)
RDW: 17.1 % — ABNORMAL HIGH (ref 11.5–15.5)
Smear Review: NORMAL
WBC: 14.8 10*3/uL — ABNORMAL HIGH (ref 4.0–10.5)
nRBC: 0 % (ref 0.0–0.2)

## 2019-02-01 LAB — URINALYSIS, ROUTINE W REFLEX MICROSCOPIC
Bilirubin Urine: NEGATIVE
Glucose, UA: NEGATIVE mg/dL
Hgb urine dipstick: NEGATIVE
Ketones, ur: NEGATIVE mg/dL
Leukocytes,Ua: NEGATIVE
Nitrite: NEGATIVE
Protein, ur: NEGATIVE mg/dL
Specific Gravity, Urine: 1.018 (ref 1.005–1.030)
pH: 5 (ref 5.0–8.0)

## 2019-02-01 LAB — COMPREHENSIVE METABOLIC PANEL
ALT: 9 U/L (ref 0–44)
AST: 17 U/L (ref 15–41)
Albumin: 3.8 g/dL (ref 3.5–5.0)
Alkaline Phosphatase: 99 U/L (ref 38–126)
Anion gap: 9 (ref 5–15)
BUN: 22 mg/dL (ref 8–23)
CO2: 23 mmol/L (ref 22–32)
Calcium: 9 mg/dL (ref 8.9–10.3)
Chloride: 110 mmol/L (ref 98–111)
Creatinine, Ser: 1.35 mg/dL — ABNORMAL HIGH (ref 0.61–1.24)
GFR calc Af Amer: 57 mL/min — ABNORMAL LOW (ref >60)
GFR calc non Af Amer: 50 mL/min — ABNORMAL LOW (ref >60)
Glucose, Bld: 104 mg/dL — ABNORMAL HIGH (ref 70–99)
Potassium: 5.1 mmol/L (ref 3.5–5.1)
Sodium: 142 mmol/L (ref 135–145)
Total Bilirubin: 0.5 mg/dL (ref 0.3–1.2)
Total Protein: 7.1 g/dL (ref 6.5–8.1)

## 2019-02-01 LAB — TROPONIN I (HIGH SENSITIVITY)
Troponin I (High Sensitivity): 8 ng/L (ref <18)
Troponin I (High Sensitivity): 8 ng/L (ref <18)

## 2019-02-01 LAB — LACTIC ACID, PLASMA: Lactic Acid, Venous: 1.4 mmol/L (ref 0.5–1.9)

## 2019-02-01 LAB — SARS CORONAVIRUS 2 BY RT PCR (HOSPITAL ORDER, PERFORMED IN ~~LOC~~ HOSPITAL LAB): SARS Coronavirus 2: NEGATIVE

## 2019-02-01 MED ORDER — ACETAMINOPHEN 325 MG PO TABS: 650.0000 mg | ORAL_TABLET | ORAL | Status: DC | PRN

## 2019-02-01 MED ORDER — NITROGLYCERIN 0.4 MG/SPRAY TL SOLN
1.0000 | Status: DC | PRN
  Filled 2019-02-01: qty 4.9

## 2019-02-01 MED ORDER — LISINOPRIL 20 MG PO TABS
40.0000 mg | ORAL_TABLET | Freq: Every day | ORAL | Status: DC
  Administered 2019-02-02: 40 mg via ORAL
  Filled 2019-02-01: qty 2

## 2019-02-01 MED ORDER — IPRATROPIUM-ALBUTEROL 0.5-2.5 (3) MG/3ML IN SOLN
3.0000 mL | Freq: Once | RESPIRATORY_TRACT | Status: AC
  Administered 2019-02-01: 3 mL via RESPIRATORY_TRACT
  Filled 2019-02-01: qty 3

## 2019-02-01 MED ORDER — FUROSEMIDE 10 MG/ML IJ SOLN
20.0000 mg | Freq: Once | INTRAMUSCULAR | Status: AC
  Administered 2019-02-01: 23:00:00 20 mg via INTRAVENOUS
  Filled 2019-02-01: qty 4

## 2019-02-01 MED ORDER — FUROSEMIDE 10 MG/ML IJ SOLN
40.0000 mg | Freq: Two times a day (BID) | INTRAMUSCULAR | Status: DC
  Administered 2019-02-02: 40 mg via INTRAVENOUS
  Filled 2019-02-01: qty 4

## 2019-02-01 MED ORDER — ENOXAPARIN SODIUM 40 MG/0.4ML ~~LOC~~ SOLN
40.0000 mg | SUBCUTANEOUS | Status: DC
  Administered 2019-02-02: 40 mg via SUBCUTANEOUS
  Filled 2019-02-01: qty 0.4

## 2019-02-01 MED ORDER — NITROGLYCERIN 0.4 MG SL SUBL: 0.4000 mg | SUBLINGUAL_TABLET | SUBLINGUAL | Status: DC | PRN

## 2019-02-01 MED ORDER — MORPHINE SULFATE (PF) 2 MG/ML IV SOLN: 2.0000 mg | INTRAVENOUS | Status: DC | PRN

## 2019-02-01 MED ORDER — GUAIFENESIN-DM 100-10 MG/5ML PO SYRP
5.0000 mL | ORAL_SOLUTION | Freq: Four times a day (QID) | ORAL | Status: DC | PRN
  Administered 2019-02-02: 02:00:00 5 mL via ORAL
  Filled 2019-02-01 (×2): qty 5

## 2019-02-01 MED ORDER — DULOXETINE HCL 30 MG PO CPEP: 30.0000 mg | ORAL_CAPSULE | Freq: Every day | ORAL | Status: DC

## 2019-02-01 MED ORDER — ASPIRIN 81 MG PO CHEW
324.0000 mg | CHEWABLE_TABLET | Freq: Once | ORAL | Status: AC
  Administered 2019-02-01: 22:00:00 324 mg via ORAL
  Filled 2019-02-01: qty 4

## 2019-02-01 MED ORDER — ASPIRIN EC 325 MG PO TBEC
325.0000 mg | DELAYED_RELEASE_TABLET | Freq: Every day | ORAL | Status: DC
  Administered 2019-02-02: 325 mg via ORAL
  Filled 2019-02-01: qty 1

## 2019-02-01 MED ORDER — ZOLPIDEM TARTRATE 5 MG PO TABS: 5.0000 mg | ORAL_TABLET | Freq: Every evening | ORAL | Status: DC | PRN

## 2019-02-01 MED ORDER — DIAZEPAM 5 MG PO TABS: 5.0000 mg | ORAL_TABLET | Freq: Three times a day (TID) | ORAL | Status: DC | PRN

## 2019-02-01 MED ORDER — ATORVASTATIN CALCIUM 20 MG PO TABS
40.0000 mg | ORAL_TABLET | Freq: Every day | ORAL | Status: DC
  Administered 2019-02-02: 40 mg via ORAL
  Filled 2019-02-01: qty 2

## 2019-02-01 MED ORDER — LIDOCAINE VISCOUS HCL 2 % MT SOLN
15.0000 mL | Freq: Once | OROMUCOSAL | Status: DC
  Filled 2019-02-01: qty 15

## 2019-02-01 MED ORDER — LORATADINE 10 MG PO TABS
10.0000 mg | ORAL_TABLET | Freq: Every day | ORAL | Status: DC
  Administered 2019-02-02: 10 mg via ORAL
  Filled 2019-02-01: qty 1

## 2019-02-01 MED ORDER — ONDANSETRON HCL 4 MG/2ML IJ SOLN: 4.0000 mg | Freq: Four times a day (QID) | INTRAMUSCULAR | Status: DC | PRN

## 2019-02-01 MED ORDER — ALUM & MAG HYDROXIDE-SIMETH 200-200-20 MG/5ML PO SUSP: 30.0000 mL | Freq: Once | ORAL | Status: DC

## 2019-02-01 MED ORDER — IPRATROPIUM-ALBUTEROL 0.5-2.5 (3) MG/3ML IN SOLN: 3.0000 mL | RESPIRATORY_TRACT | Status: DC | PRN

## 2019-02-01 MED ORDER — METHYLPREDNISOLONE SODIUM SUCC 125 MG IJ SOLR
125.0000 mg | Freq: Once | INTRAMUSCULAR | Status: AC
  Administered 2019-02-01: 125 mg via INTRAVENOUS
  Filled 2019-02-01: qty 2

## 2019-02-01 MED ORDER — GABAPENTIN 100 MG PO CAPS: 100.0000 mg | ORAL_CAPSULE | Freq: Every day | ORAL | Status: DC

## 2019-02-01 NOTE — H&P (Addendum)
[employer] - [place] at [facility]   PATIENT NAME: William Wilcox    MR#:  [id number]  DATE OF BIRTH:  08/31/1940  DATE OF ADMISSION:  02/01/2019  PRIMARY CARE PHYSICIAN: [facility]   REQUESTING/REFERRING PHYSICIAN: Artis Delay, MD CHIEF COMPLAINT:   Chief Complaint  Patient presents with  . Chest Pain    HISTORY OF PRESENT ILLNESS:  William Wilcox  is a 78 y.o. Caucasian male with a known history of COPD, coronary artery disease and hypertension who presents emergency room with onset of left parasternal chest pain felt as pressure and heaviness with no nausea or vomiting or diaphoresis or dyspnea or palpitations.  He denied any leg pain or edema recent travels or surgeries.  No cough or wheezing or hemoptysis.  No fever or chills.  He continues to smoke 1 pack of stress per day.  When he came to the ER blood pressure was 132/57 with otherwise normal vital signs.  Labs revealed a potassium of 5.1 with a BUN of 22 and creatinine 1.35 and his high-sensitivity troponin I was 8 and later 8, lactic acid was 1.4 and CBC showed leukocytosis with WBC of 14.8 with anemia and neutrophilia.  COVID-19 test came back negative.  His EKG showed no sinus rhythm with a rate of 82 with T wave inversion in V1 and V2 as well as laterally.  His portable chest x-ray revealed mild diffuse pulmonary edema.  The patient was given 4 of aspirin, 20 mg of IV Lasix and 3 duo nebs as well as 125 mg IV Solu-Medrol.  He will be admitted to an observation telemetry bed for further evaluation and management. PAST MEDICAL HISTORY:   Past Medical History:  Diagnosis Date  . COPD (chronic obstructive pulmonary disease) (HCC)   . Coronary artery disease   . Hypertension   . MI (myocardial infarction) (HCC)   Ongoing tobacco abuse Dyslipidemia Anxiety and depression PAST SURGICAL HISTORY:   Past Surgical History:  Procedure Laterality Date  . BACK SURGERY    . CARDIAC SURGERY     triple bypass  . HIP SURGERY Left     SOCIAL HISTORY:   Social History   Tobacco Use  . Smoking status: Current Every Day Smoker    Packs/day: 2.00    Years: 57.00    Pack years: 114.00    Types: Cigarettes  . Smokeless tobacco: Never Used  Substance Use Topics  . Alcohol use: No    FAMILY HISTORY:  He is not aware of any familial diseases in his family.  DRUG ALLERGIES:  No Known Allergies  REVIEW OF SYSTEMS:   ROS As per history of present illness. All pertinent systems were reviewed above. Constitutional,  HEENT, cardiovascular, respiratory, GI, GU, musculoskeletal, neuro, psychiatric, endocrine,  integumentary and hematologic systems were reviewed and are otherwise  negative/unremarkable except for positive findings mentioned above in the HPI.   MEDICATIONS AT HOME:   Prior to Admission medications   Medication Sig Start Date End Date Taking? Authorizing Provider  acetaminophen (TYLENOL) 325 MG tablet Take 650 mg by mouth.    [provider]  albuterol (PROVENTIL HFA;VENTOLIN HFA) 108 (90 Base) MCG/ACT inhaler Inhale into the lungs.    [provider]  ammonium lactate (LAC-HYDRIN) 12 % lotion Apply 1 application topically as needed for dry skin.    [provider]  diazepam (VALIUM) 5 MG tablet Take 1 tablet (5 mg total) by mouth every 8 (eight) hours as needed  for muscle spasms. 07/21/17   Earleen Newport, MD  doxycycline (VIBRAMYCIN) 100 MG capsule Take 100 mg by mouth.    [provider]  DULoxetine (CYMBALTA) 30 MG capsule Take 30 mg by mouth daily.    [provider]  gabapentin (NEURONTIN) 100 MG capsule Take 100 mg by mouth.    [provider]  guaiFENesin-dextromethorphan (ROBITUSSIN DM) 100-10 MG/5ML syrup Take 5 mLs by mouth.    [provider]  ibuprofen (ADVIL,MOTRIN) 400 MG tablet Take 1 tablet (400 mg total) by mouth every 8 (eight) hours as needed. 07/21/17   Earleen Newport, MD   ibuprofen (ADVIL,MOTRIN) 600 MG tablet Take 600 mg by mouth. 10/23/15   [provider]  ipratropium-albuterol (DUONEB) 0.5-2.5 (3) MG/3ML SOLN Take 3 mLs by nebulization every 4 (four) hours as needed.    [provider]  lisinopril (PRINIVIL,ZESTRIL) 40 MG tablet Take 40 mg by mouth daily.    [provider]  loratadine (CLARITIN) 10 MG tablet Take 10 mg by mouth daily.    [provider]  predniSONE (DELTASONE) 20 MG tablet Take 20 mg by mouth.    [provider]  simvastatin (ZOCOR) 80 MG tablet Take 80 mg by mouth every evening.    [provider]  triamcinolone cream (KENALOG) 0.1 % Apply 1 application topically.    [provider]      VITAL SIGNS:  Blood pressure (!) 129/51, pulse 75, temperature 100.1 F (37.8 C), temperature source Oral, resp. rate 18, height 5\' 5"  (1.651 m), weight 54.4 kg, SpO2 97 %.  PHYSICAL EXAMINATION:  Physical Exam  GENERAL:  78 y.o.-year-old Caucasian male patient lying in the bed with minimal respiratory distress with conversational dyspnea EYES: Pupils equal, round, reactive to light and accommodation. No scleral icterus. Extraocular muscles intact.  HEENT: Head atraumatic, normocephalic. Oropharynx and nasopharynx clear.  NECK:  Supple, no jugular venous distention. No thyroid enlargement, no tenderness.  LUNGS: Slightly managed bibasilar breath sounds with minimal bibasilar rales CARDIOVASCULAR: Regular rate and rhythm, S1, S2 normal. No murmurs, rubs, or gallops.  ABDOMEN: Soft, nondistended, nontender. Bowel sounds present. No organomegaly or mass.  EXTREMITIES: Trace bilateral lower extremity pitting edema with no clubbing or cyanosis.  NEUROLOGIC: Cranial nerves II through XII are intact. Muscle strength 5/5 in all extremities. Sensation intact. Gait not checked.  PSYCHIATRIC: The patient is alert and oriented x 3.  Normal affect and good eye contact. SKIN: No obvious rash, lesion, or  ulcer.   LABORATORY PANEL:   CBC Recent Labs  Lab 02/01/19 2024  WBC 14.8*  HGB 10.9*  HCT 32.8*  PLT 154   ------------------------------------------------------------------------------------------------------------------  Chemistries  Recent Labs  Lab 02/01/19 2024  NA 142  K 5.1  CL 110  CO2 23  GLUCOSE 104*  BUN 22  CREATININE 1.35*  CALCIUM 9.0  AST 17  ALT 9  ALKPHOS 99  BILITOT 0.5   ------------------------------------------------------------------------------------------------------------------  Cardiac Enzymes No results for input(s): TROPONINI in the last 168 hours. ------------------------------------------------------------------------------------------------------------------  RADIOLOGY:  Dg Chest 1 View  Result Date: 02/01/2019 CLINICAL DATA:  Chest pain EXAM: CHEST  1 VIEW COMPARISON:  03/03/2017 FINDINGS: Previous median sternotomy and CABG procedure. Normal heart size. Diffusely increased interstitial markings are noted bilaterally compatible with pulmonary edema. No airspace consolidation. IMPRESSION: 1. Mild diffuse pulmonary edema. Electronically Signed   By: Kerby Moors M.D.   On: 02/01/2019 20:45      IMPRESSION AND PLAN:   1.Chest pain, rule  out acute coronary syndrome.  The patient will be admitted to an observation telemetry bed.  Will follow serial cardiac and obtain a 2D echo and a cardiology consultation in a.m.  And EKGs.  We will obtain a cardiology consult in a.m. for further cardiac risk stratification.  The patient will be placed on aspirin as well as p.r.n. sublingual nitroglycerin and morphine sulfate for pain. I notified Dr. Gwen PoundsKowalski about the patient.  2.  Acute pulmonary edema.  This could be secondary to new onset acute CHF, likely diastolic.  The patient will be diuresed with IV Lasix.  Will follow serial cardiac enzymes and obtain a 2D echo and cardiology consultation in a.m.  3.  Ongoing tobacco abuse.  I counseled  him for smoking cessation he will receive further counseling here.  4.  Coronary artery disease.  We will continue statin therapy and check fasting lipids.  Holding off lisinopril given his borderline potassium of 5.1.  He will be placed on aspirin  5.  Dyslipidemia.  Continue statin therapy.  6.  Mild hyperkalemia.  We will hold off lisinopril.  The patient will be diuresed.  7.  Depression and anxiety.  Cymbalta and Valium will be resumed.  8.  COPD.  We will continue his duo nebs.  9.  DVT prophylaxis.  Subcutaneous Lovenox.   All the records are reviewed and case discussed with ED provider. The plan of care was discussed in details with the patient (and family). I answered all questions. The patient agreed to proceed with the above mentioned plan. Further management will depend upon hospital course.   CODE STATUS: Full code  TOTAL TIME TAKING CARE OF THIS PATIENT: 50 minutes.    Hannah BeatJan A Limmie Schoenberg M.D on 02/01/2019 at 11:20 PM  Pager - [phone]  After 6pm go to [email or web address] - Social research officer, governmentpassword [email or web address]  [facility] [place] [facility]  Office  [phone]  CC: Primary care physician; [facility]   Note: This dictation was prepared with Dragon dictation along with smaller phrase technology. Any transcriptional errors that result from this process are unintentional.

## 2019-02-01 NOTE — ED Provider Notes (Signed)
[facility] Emergency Department Provider Note  ____________________________________________   First MD Initiated Contact with Patient 02/01/19 2014     (approximate)  I have reviewed the triage vital signs and the nursing notes.   HISTORY  Chief Complaint Chest Pain    HPI Jerrel IvorySamuel C Zimmers is a 78 y.o. male with COPD, CAD status post CABG per patient that was years ago who presents with chest pain.  Patient endorses a sudden onset of chest pain around 7 PM.  The chest pain was on the left side of his chest, dull sensation, originally severe and better with nitro given by EMS, nothing seemed to make it worse.  Patient has a chronic cough and some exertional shortness of breath secondary to COPD.  He continues to smoke.  He denies any chest pain prior to today.  Denies any recent work-ups for chest pain.  Denies any leg swelling or history of blood clots.  Currently his pain is resolved.          Past Medical History:  Diagnosis Date  . COPD (chronic obstructive pulmonary disease) (HCC)   . Coronary artery disease   . Hypertension     Patient Active Problem List   Diagnosis Date Noted  . Leg pain 02/09/2017  . Lower extremity pain, bilateral 10/04/2016  . Dyslipidemia 04/14/2015  . Tobacco use disorder 04/14/2015  . 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 Major depressive disorder, single episode, severe without psychotic features (HCC) 04/13/2015  . COPD (chronic obstructive pulmonary disease) (HCC) 04/13/2015  . Chronic hip pain 04/13/2015  . Hypertension 04/13/2015    Past Surgical History:  Procedure Laterality Date  . BACK SURGERY    . CARDIAC SURGERY     triple bypass  . HIP SURGERY Left     Prior to Admission medications   Medication Sig Start Date End Date Taking? Authorizing Provider  acetaminophen (TYLENOL) 325 MG tablet Take 650 mg by mouth.    [provider]   albuterol (PROVENTIL HFA;VENTOLIN HFA) 108 (90 Base) MCG/ACT inhaler Inhale into the lungs.    [provider]  ammonium lactate (LAC-HYDRIN) 12 % lotion Apply 1 application topically as needed for dry skin.    [provider]  diazepam (VALIUM) 5 MG tablet Take 1 tablet (5 mg total) by mouth every 8 (eight) hours as needed for muscle spasms. 07/21/17   Emily FilbertWilliams, Jonathan E, MD  doxycycline (VIBRAMYCIN) 100 MG capsule Take 100 mg by mouth.    [provider]  DULoxetine (CYMBALTA) 30 MG capsule Take 30 mg by mouth daily.    [provider]  gabapentin (NEURONTIN) 100 MG capsule Take 100 mg by mouth.    [provider]  guaiFENesin-dextromethorphan (ROBITUSSIN DM) 100-10 MG/5ML syrup Take 5 mLs by mouth.    [provider]  ibuprofen (ADVIL,MOTRIN) 400 MG tablet Take 1 tablet (400 mg total) by mouth every 8 (eight) hours as needed. 07/21/17   Emily FilbertWilliams, Jonathan E, MD  ibuprofen (ADVIL,MOTRIN) 600 MG tablet Take 600 mg by mouth. 10/23/15   [provider]  ipratropium-albuterol (DUONEB) 0.5-2.5 (3) MG/3ML SOLN Take 3 mLs by nebulization every 4 (four) hours as needed.    [provider]  lisinopril (PRINIVIL,ZESTRIL) 40 MG tablet Take 40 mg by mouth daily.    [provider]  loratadine (CLARITIN) 10 MG tablet Take 10 mg by mouth daily.    [provider]  predniSONE (DELTASONE) 20 MG  tablet Take 20 mg by mouth.    [provider]  simvastatin (ZOCOR) 80 MG tablet Take 80 mg by mouth every evening.    [provider]  triamcinolone cream (KENALOG) 0.1 % Apply 1 application topically.    [provider]    Allergies Patient has no known allergies.  No family history on file.  Social History Social History   Tobacco Use  . Smoking status: Current Every Day Smoker    Packs/day: 2.00    Years: 57.00    Pack years: 114.00    Types: Cigarettes  . Smokeless tobacco: Never Used   Substance Use Topics  . Alcohol use: No  . Drug use: No      Review of Systems Constitutional: No fever/chills Eyes: No visual changes. ENT: No sore throat. Cardiovascular: Positive chest pain Respiratory: Denies shortness of breath. + cough  Gastrointestinal: No abdominal pain.  No nausea, no vomiting.  No diarrhea.  No constipation. Genitourinary: Negative for dysuria. Musculoskeletal: Negative for back pain. Skin: Negative for rash. Neurological: Negative for headaches, focal weakness or numbness. All other ROS negative ____________________________________________   PHYSICAL EXAM:  VITAL SIGNS: ED Triage Vitals  Enc Vitals Group     BP 02/01/19 2019 131/77     Pulse Rate 02/01/19 2019 89     Resp --      Temp --      Temp src --      SpO2 02/01/19 2019 99 %     Weight 02/01/19 2021 120 lb (54.4 kg)     Height 02/01/19 2021 5\' 5"  (1.651 m)     Head Circumference --      Peak Flow --      Pain Score 02/01/19 2019 0     Pain Loc --      Pain Edu? --      Excl. in [place]? --     Constitutional: Alert and oriented. Well appearing and in no acute distress. Eyes: Conjunctivae are normal. EOMI. Head: Atraumatic. Nose: No congestion/rhinnorhea. Mouth/Throat: Mucous membranes are moist.   Neck: No stridor. Trachea Midline. FROM Cardiovascular: Normal rate, regular rhythm. Grossly normal heart sounds.  Good peripheral circulation. Respiratory: Normal respiratory effort.  No retractions.  Bilateral expiratory wheezing Gastrointestinal: Soft and nontender. No distention. No abdominal bruits.  Musculoskeletal: No lower extremity tenderness nor edema.  No joint effusions. Neurologic:  Normal speech and language. No gross focal neurologic deficits are appreciated.  Skin:  Skin is warm, dry and intact. No rash noted. Psychiatric: Mood and affect are normal. Speech and behavior are normal. GU: Deferred   ____________________________________________   LABS (all labs ordered  are listed, but only abnormal results are displayed)  Labs Reviewed  SARS CORONAVIRUS 2 BY RT PCR ([facility])  CBC WITH DIFFERENTIAL/PLATELET  COMPREHENSIVE METABOLIC PANEL  TROPONIN I (HIGH SENSITIVITY)   ____________________________________________   ED ECG REPORT I, Vanessa [place], the attending physician, personally viewed and interpreted this ECG.  EKG is normal sinus rate of 82, no ST elevation but does have a inverted T wave in V2 and in aVL.  On review of an EKG 1 year ago patient had some flipped T waves as well similarly.  ____________________________________________  [place], personally viewed and evaluated these images (plain radiographs) as part of my medical decision making, as well as reviewing the written report by the radiologist.  ED MD interpretation: Patchy infiltrates bilaterally.  Official  radiology report(s): Dg Chest 1 View  Result Date: 02/01/2019 CLINICAL DATA:  Chest pain EXAM: CHEST  1 VIEW COMPARISON:  03/03/2017 FINDINGS: Previous median sternotomy and CABG procedure. Normal heart size. Diffusely increased interstitial markings are noted bilaterally compatible with pulmonary edema. No airspace consolidation. IMPRESSION: 1. Mild diffuse pulmonary edema. Electronically Signed   By: Signa Kell M.D.   On: 02/01/2019 20:45    ____________________________________________   PROCEDURES  Procedure(s) performed (including Critical Care):  Procedures   ____________________________________________   INITIAL IMPRESSION / ASSESSMENT AND PLAN / ED COURSE  DAYLEN LIPSKY was evaluated in Emergency Department on 02/01/2019 for the symptoms described in the history of present illness. He was evaluated in the context of the global COVID-19 pandemic, which necessitated consideration that the patient might be at risk for infection with the SARS-CoV-2 virus that causes COVID-19. Institutional  protocols and algorithms that pertain to the evaluation of patients at risk for COVID-19 are in a state of rapid change based on information released by regulatory bodies including the CDC and federal and state organizations. These policies and algorithms were followed during the patient's care in the ED.    Patient reports sudden onset chest pain.  Given patient's history of cardiac disease will get EKG and cardiac markers to evaluate for ACS.  No pain rating to the back and pain is now resolved so very low suspicion for dissection.  No risk factors for pulmonary embolism.  Patient also noted to have a slightly low-grade temperature so we will get coronavirus testing.  Patient does have some wheezing on examination so testing is negative will treat with some duo nebs and steroids.  Initial troponin was 8.  Chest x-ray with pulmonary edema.  Given patient is a high risk chest pain or with new pulmonary edema no recent echo will discuss with the hospital team for admission.  Patient also given breathing treatments and steroids due to his wheezing from his COPD.    ____________________________________________   FINAL CLINICAL IMPRESSION(S) / ED DIAGNOSES   Final diagnoses:  Chest pain, unspecified type      MEDICATIONS GIVEN DURING THIS VISIT:  Medications  ipratropium-albuterol (DUONEB) 0.5-2.5 (3) MG/3ML nebulizer solution 3 mL (has no administration in time range)  ipratropium-albuterol (DUONEB) 0.5-2.5 (3) MG/3ML nebulizer solution 3 mL (has no administration in time range)  ipratropium-albuterol (DUONEB) 0.5-2.5 (3) MG/3ML nebulizer solution 3 mL (has no administration in time range)  methylPREDNISolone sodium succinate (SOLU-MEDROL) 125 mg/2 mL injection 125 mg (has no administration in time range)  furosemide (LASIX) injection 20 mg (has no administration in time range)  aspirin chewable tablet 324 mg (324 mg Oral Given 02/01/19 2138)     ED Discharge Orders    None        Note:  This document was prepared using Dragon voice recognition software and may include unintentional dictation errors.   Concha Se, MD 02/01/19 2233

## 2019-02-01 NOTE — ED Notes (Signed)
MD at the bedside to discuss plan of care.  

## 2019-02-01 NOTE — ED Triage Notes (Signed)
PT to ED via EMS from the [facility] of [place] with c/o sudden onset of sharp chest pain to left side of mid chest. PT initially rate pain 5/10 and after 1 SL Nitro pain is at a 2. PT has had productive cough x3 days PT is AO. Hx of MI and COPD.

## 2019-02-02 ENCOUNTER — Observation Stay
Admit: 2019-02-02 | Discharge: 2019-02-02 | Disposition: A | Discharge status: Home / Self Care | Payer: Medicare Other | Attending: Family Medicine | Admitting: Family Medicine

## 2019-02-02 DIAGNOSIS — R079 Chest pain, unspecified: Secondary | ICD-10-CM | POA: Diagnosis not present

## 2019-02-02 LAB — ECHOCARDIOGRAM COMPLETE
Height: 65 in
Weight: 2147.2 oz

## 2019-02-02 LAB — LIPID PANEL
Cholesterol: 111 mg/dL (ref 0–200)
HDL: 34 mg/dL — ABNORMAL LOW (ref >40)
LDL Cholesterol: 65 mg/dL (ref 0–99)
Total CHOL/HDL Ratio: 3.3 RATIO
Triglycerides: 62 mg/dL (ref <150)
VLDL: 12 mg/dL (ref 0–40)

## 2019-02-02 LAB — BASIC METABOLIC PANEL
Anion gap: 11 (ref 5–15)
BUN: 23 mg/dL (ref 8–23)
CO2: 21 mmol/L — ABNORMAL LOW (ref 22–32)
Calcium: 9.1 mg/dL (ref 8.9–10.3)
Chloride: 108 mmol/L (ref 98–111)
Creatinine, Ser: 1.48 mg/dL — ABNORMAL HIGH (ref 0.61–1.24)
GFR calc Af Amer: 51 mL/min — ABNORMAL LOW (ref >60)
GFR calc non Af Amer: 44 mL/min — ABNORMAL LOW (ref >60)
Glucose, Bld: 169 mg/dL — ABNORMAL HIGH (ref 70–99)
Potassium: 4.9 mmol/L (ref 3.5–5.1)
Sodium: 140 mmol/L (ref 135–145)

## 2019-02-02 LAB — TROPONIN I (HIGH SENSITIVITY): Troponin I (High Sensitivity): 6 ng/L (ref <18)

## 2019-02-02 LAB — MRSA PCR SCREENING: MRSA by PCR: POSITIVE — AB

## 2019-02-02 MED ORDER — SODIUM CHLORIDE 0.9% FLUSH
3.0000 mL | Freq: Two times a day (BID) | INTRAVENOUS | Status: DC
  Administered 2019-02-02 (×2): 3 mL via INTRAVENOUS

## 2019-02-02 MED ORDER — CHLORHEXIDINE GLUCONATE CLOTH 2 % EX PADS: 6.0000 | MEDICATED_PAD | Freq: Every day | CUTANEOUS | Status: DC

## 2019-02-02 MED ORDER — ASPIRIN EC 81 MG PO TBEC: 81.0000 mg | DELAYED_RELEASE_TABLET | Freq: Every day | ORAL | 2 refills | Status: DC

## 2019-02-02 MED ORDER — MUPIROCIN 2 % EX OINT
1.0000 "application " | TOPICAL_OINTMENT | Freq: Two times a day (BID) | CUTANEOUS | Status: DC
  Filled 2019-02-02: qty 22

## 2019-02-02 NOTE — Discharge Summary (Addendum)
[facility] [place] at [facility]   PATIENT NAME: William Wilcox    MR#:  [id number]  DATE OF BIRTH:  [birth year]  DATE OF ADMISSION:  02/01/2019   ADMITTING PHYSICIAN: Hannah Beat, MD  DATE OF DISCHARGE: 02/02/2019 PRIMARY CARE PHYSICIAN: [facility]   ADMISSION DIAGNOSIS:  Chest pain, unspecified type [R07.9] DISCHARGE DIAGNOSIS:  Active Problems:   Chest pain  SECONDARY DIAGNOSIS:   Past Medical History:  Diagnosis Date  . COPD (chronic obstructive pulmonary disease) (HCC)   . Coronary artery disease   . Hypertension   . MI (myocardial infarction) [facility])    HOSPITAL COURSE:  1.Chest pain, atypical, ruled out acute coronary syndrome.  Ischemia work-up as outpatient, echocardiography, and to follow-up Dr. Gwen Pounds as outpatient for cardiology consult. Echo: Left ventricular ejection fraction, by visual estimation, is 65 to 70%. The left ventricle has hyperdynamic function. There is severely increased left ventricular hypertrophy.  2.  Acute pulmonary edema.  He was treated with Lasix.  No signs of fluid overload.  Discontinue Lasix.  CKD stage III.  Stable.  3.  Ongoing tobacco abuse.  smoking cessation counseling 3-4 minutes by me.  4.  Coronary artery disease.  Started aspirin.  Continue statin. 5.  Dyslipidemia.  Continue statin therapy.  6.  Mild hyperkalemia.  We will hold off lisinopril.  The patient will be diuresed.  7.  Depression and anxiety.  Cymbalta and Valium will be resumed.  8.  COPD.  We will continue his duo nebs.  DISCHARGE CONDITIONS:  Stable, discharge to home today. CONSULTS OBTAINED:   DRUG ALLERGIES:  No Known Allergies DISCHARGE MEDICATIONS:   Allergies as of 02/02/2019   No Known Allergies     Medication List    STOP taking these medications   ibuprofen 400 MG tablet Commonly known as: ADVIL     TAKE these medications   acetaminophen 325 MG tablet Commonly known as: TYLENOL Take 650  mg by mouth.   albuterol 108 (90 Base) MCG/ACT inhaler Commonly known as: VENTOLIN HFA Inhale into the lungs.   ALPRAZolam 0.25 MG tablet Commonly known as: XANAX Take 0.25 mg by mouth at bedtime as needed for anxiety.   alum & mag hydroxide-simeth 200-200-20 MG/5ML suspension Commonly known as: MAALOX/MYLANTA Take 30 mLs by mouth every 6 (six) hours as needed for indigestion or heartburn.   ammonium lactate 12 % lotion Commonly known as: LAC-HYDRIN Apply 1 application topically as needed for dry skin.   aspirin EC 81 MG tablet Take 1 tablet (81 mg total) by mouth daily.   bacitracin-polymyxin b ointment Commonly known as: POLYSPORIN Apply topically 2 (two) times daily.   diphenhydrAMINE 25 MG tablet Commonly known as: BENADRYL Take 25 mg by mouth every 6 (six) hours as needed.   EPINEPHrine 0.3 mg/0.3 mL Soaj injection Commonly known as: EPI-PEN Inject 0.3 mg into the muscle as needed for anaphylaxis.   guaiFENesin-dextromethorphan 100-10 MG/5ML syrup Commonly known as: ROBITUSSIN DM Take 5 mLs by mouth.   ipratropium-albuterol 0.5-2.5 (3) MG/3ML Soln Commonly known as: DUONEB Take 3 mLs by nebulization every 4 (four) hours as needed.   Combivent Respimat 20-100 MCG/ACT Aers respimat Generic drug: Ipratropium-Albuterol Inhale 1 puff into the lungs every 6 (six) hours.   lisinopril 40 MG tablet Commonly known as: ZESTRIL Take 40 mg by mouth daily.   loperamide 2 MG capsule Commonly known as: IMODIUM Take 2 mg by mouth as needed for diarrhea or loose stools.  magnesium hydroxide 400 MG/5ML suspension Commonly known as: MILK OF MAGNESIA Take 30 mLs by mouth daily as needed for mild constipation.   multivitamin-lutein Caps capsule Take 1 capsule by mouth daily.   nitroGLYCERIN 0.4 MG SL tablet Commonly known as: NITROSTAT Place 0.4 mg under the tongue every 5 (five) minutes as needed for chest pain.   NON FORMULARY Apply 1 application topically 2 (two)  times daily. Benadryl cream 2%   OLANZapine 2.5 MG tablet Commonly known as: ZYPREXA Take 2.5 mg by mouth at bedtime.   prazosin 5 MG capsule Commonly known as: MINIPRESS Take 5 mg by mouth at bedtime.   sertraline 50 MG tablet Commonly known as: ZOLOFT Take 50 mg by mouth daily.   simvastatin 40 MG tablet Commonly known as: ZOCOR Take 40 mg by mouth every evening.   tiotropium 18 MCG inhalation capsule Commonly known as: SPIRIVA Place 18 mcg into inhaler and inhale daily.   traMADol 50 MG tablet Commonly known as: ULTRAM Take 50 mg by mouth every 12 (twelve) hours as needed.   traZODone 50 MG tablet Commonly known as: DESYREL Take 50 mg by mouth at bedtime as needed for sleep.   triamcinolone cream 0.1 % Commonly known as: KENALOG Apply 1 application topically 2 (two) times daily.   trolamine salicylate 10 % cream Commonly known as: ASPERCREME Apply 1 application topically as needed for muscle pain.   white petrolatum ointment Apply 1 application topically daily.        DISCHARGE INSTRUCTIONS:  See AVS.  If you experience worsening of your admission symptoms, develop shortness of breath, life threatening emergency, suicidal or homicidal thoughts you must seek medical attention immediately by calling 911 or calling your MD immediately  if symptoms less severe.  You Must read complete instructions/literature along with all the possible adverse reactions/side effects for all the Medicines you take and that have been prescribed to you. Take any new Medicines after you have completely understood and accpet all the possible adverse reactions/side effects.   Please note  You were cared for by a hospitalist during your hospital stay. If you have any questions about your discharge medications or the care you received while you were in the hospital after you are discharged, you can call the unit and asked to speak with the hospitalist on call if the hospitalist that took  care of you is not available. Once you are discharged, your primary care physician will handle any further medical issues. Please note that NO REFILLS for any discharge medications will be authorized once you are discharged, as it is imperative that you return to your primary care physician (or establish a relationship with a primary care physician if you do not have one) for your aftercare needs so that they can reassess your need for medications and monitor your lab values.    On the day of Discharge:  VITAL SIGNS:  Blood pressure (!) 151/68, pulse 88, temperature 97.7 F (36.5 C), temperature source Oral, resp. rate 18, height 5\' 5"  (1.651 m), weight 60.9 kg, SpO2 97 %. PHYSICAL EXAMINATION:  GENERAL:  78 y.o.-year-old patient lying in the bed with no acute distress.  EYES: Pupils equal, round, reactive to light and accommodation. No scleral icterus. Extraocular muscles intact.  HEENT: Head atraumatic, normocephalic. NECK:  Supple, no jugular venous distention. No thyroid enlargement, no tenderness.  LUNGS: Normal breath sounds bilaterally, no wheezing, rales,rhonchi or crepitation. No use of accessory muscles of respiration.  CARDIOVASCULAR: S1, S2 normal. No  murmurs, rubs, or gallops.  ABDOMEN: Soft, non-tender, non-distended. Bowel sounds present. No organomegaly or mass.  EXTREMITIES: No pedal edema, cyanosis, or clubbing.  NEUROLOGIC: Cranial nerves II through XII are intact. Muscle strength 5/5 in all extremities. Sensation intact. Gait not checked.  PSYCHIATRIC: The patient is alert and oriented x 3.  SKIN: No obvious rash, lesion, or ulcer.  DATA REVIEW:   CBC Recent Labs  Lab 02/01/19 2024  WBC 14.8*  HGB 10.9*  HCT 32.8*  PLT 154    Chemistries  Recent Labs  Lab 02/01/19 2024 02/02/19 0622  NA 142 140  K 5.1 4.9  CL 110 108  CO2 23 21*  GLUCOSE 104* 169*  BUN 22 23  CREATININE 1.35* 1.48*  CALCIUM 9.0 9.1  AST 17  --   ALT 9  --   ALKPHOS 99  --   BILITOT  0.5  --      Microbiology Results  Results for orders placed or performed during the hospital encounter of 02/01/19  SARS Coronavirus 2 by RT PCR (hospital order, performed in [facility] hospital lab) Nasopharyngeal Nasopharyngeal Swab     Status: None   Collection Time: 02/01/19  8:24 PM   Specimen: Nasopharyngeal Swab  Result Value Ref Range Status   SARS Coronavirus 2 NEGATIVE NEGATIVE Final    Comment: (NOTE) If result is NEGATIVE SARS-CoV-2 target nucleic acids are NOT DETECTED. The SARS-CoV-2 RNA is generally detectable in upper and lower  respiratory specimens during the acute phase of infection. The lowest  concentration of SARS-CoV-2 viral copies this assay can detect is 250  copies / mL. A negative result does not preclude SARS-CoV-2 infection  and should not be used as the sole basis for treatment or other  patient management decisions.  A negative result may occur with  improper specimen collection / handling, submission of specimen other  than nasopharyngeal swab, presence of viral mutation(s) within the  areas targeted by this assay, and inadequate number of viral copies  (<250 copies / mL). A negative result must be combined with clinical  observations, patient history, and epidemiological information. If result is POSITIVE SARS-CoV-2 target nucleic acids are DETECTED. The SARS-CoV-2 RNA is generally detectable in upper and lower  respiratory specimens dur ing the acute phase of infection.  Positive  results are indicative of active infection with SARS-CoV-2.  Clinical  correlation with patient history and other diagnostic information is  necessary to determine patient infection status.  Positive results do  not rule out bacterial infection or co-infection with other viruses. If result is PRESUMPTIVE POSTIVE SARS-CoV-2 nucleic acids MAY BE PRESENT.   A presumptive positive result was obtained on the submitted specimen  and confirmed on repeat testing.  While 2019  novel coronavirus  (SARS-CoV-2) nucleic acids may be present in the submitted sample  additional confirmatory testing may be necessary for epidemiological  and / or clinical management purposes  to differentiate between  SARS-CoV-2 and other Sarbecovirus currently known to infect humans.  If clinically indicated additional testing with an alternate test  methodology [phone]) is advised. The SARS-CoV-2 RNA is generally  detectable in upper and lower respiratory sp ecimens during the acute  phase of infection. The expected result is Negative. Fact Sheet for Patients:  StrictlyIdeas.no Fact Sheet for Healthcare Providers: BankingDealers.co.za This test is not yet approved or cleared by the [place] FDA and has been authorized for detection and/or diagnosis of SARS-CoV-2 by FDA under an Emergency Use Authorization (EUA).  This EUA will remain in effect (meaning this test can be used) for the duration of the COVID-19 declaration under Section 564(b)(1) of the Act, 21 U.S.C. section 360bbb-3(b)(1), unless the authorization is terminated or revoked sooner. Performed at [facility], [address] [place], [place] [address]   MRSA PCR Screening     Status: Abnormal   Collection Time: 02/02/19  5:48 AM   Specimen: Nasal Mucosa; Nasopharyngeal  Result Value Ref Range Status   MRSA by PCR POSITIVE (A) NEGATIVE Final    Comment:        The GeneXpert MRSA Assay (FDA approved for NASAL specimens only), is one component of a comprehensive MRSA colonization surveillance program. It is not intended to diagnose MRSA infection nor to guide or monitor treatment for MRSA infections. RESULT CALLED TO, READ BACK BY AND VERIFIED WITH: LISA SCOTT AT 0719 ON 02/02/2019 JJB Performed at [facility], [address] [place], [place] [address]     RADIOLOGY:  Dg Chest 1 View  Result Date: 02/01/2019 CLINICAL DATA:  Chest  pain EXAM: CHEST  1 VIEW COMPARISON:  03/03/2017 FINDINGS: Previous median sternotomy and CABG procedure. Normal heart size. Diffusely increased interstitial markings are noted bilaterally compatible with pulmonary edema. No airspace consolidation. IMPRESSION: 1. Mild diffuse pulmonary edema. Electronically Signed   By: Signa Kellaylor  Stroud M.D.   On: 02/01/2019 20:45     Management plans discussed with the patient, family and they are in agreement.  CODE STATUS: Full Code   TOTAL TIME TAKING CARE OF THIS PATIENT: 32 minutes.    Shaune PollackQing Werner Labella M.D on 02/02/2019 at 10:52 AM  Between 7am to 6pm - Pager - [phone]  After 6pm go to [email or web address] - Social research officer, governmentpassword [email or web address]  [facility] [place] [facility]  Office  [phone]  CC: Primary care physician; [facility]   Note: This dictation was prepared with Dragon dictation along with smaller phrase technology. Any transcriptional errors that result from this process are unintentional.

## 2019-02-02 NOTE — ED Notes (Signed)
ED TO INPATIENT HANDOFF REPORT  ED Nurse Name and Phone #: Sue Lush #[phone]  S Name/Age/Gender Jerrel Ivory 78 y.o. male Room/Bed: ED06A/ED06A  Code Status   Code Status: Full Code  Home/SNF/Other Nursing Home Patient oriented to: self, place, time and situation Is this baseline? Yes   Triage Complete: Triage complete  Chief Complaint chest pain ems  Triage Note PT to ED via EMS from the [facility] of  with c/o sudden onset of sharp chest pain to left side of mid chest. PT initially rate pain 5/10 and after 1 SL Nitro pain is at a 2. PT has had productive cough x3 days PT is AO. Hx of MI and COPD.    Allergies No Known Allergies  Level of Care/Admitting Diagnosis ED Disposition    ED Disposition Condition Comment   Admit  Hospital Area: [facility] [[id number]]  Level of Care: Telemetry [5]  Covid Evaluation: Confirmed COVID Negative  Diagnosis: Chest pain [960454]  Admitting Physician: Hannah Beat [[id number]]  Attending Physician: Hannah Beat [[id number]]  PT Class (Do Not Modify): Observation [104]  PT Acc Code (Do Not Modify): Observation [10022]       B Medical/Surgery History Past Medical History:  Diagnosis Date  . COPD (chronic obstructive pulmonary disease) (HCC)   . Coronary artery disease   . Hypertension   . MI (myocardial infarction) [facility])    Past Surgical History:  Procedure Laterality Date  . BACK SURGERY    . CARDIAC SURGERY     triple bypass  . HIP SURGERY Left      A IV Location/Drains/Wounds Patient Lines/Drains/Airways Status   Active Line/Drains/Airways    Name:   Placement date:   Placement time:   Site:   Days:   Peripheral IV 02/01/19 Anterior;Left Forearm   02/01/19    2006    Forearm   1          Intake/Output Last 24 hours No intake or output data in the 24 hours ending 02/02/19 0005  Labs/Imaging Results for orders placed or performed during the hospital encounter of 02/01/19 (from the past 48  hour(s))  CBC with Differential     Status: Abnormal   Collection Time: 02/01/19  8:24 PM  Result Value Ref Range   WBC 14.8 (H) 4.0 - 10.5 K/uL   RBC 3.54 (L) 4.22 - 5.81 MIL/uL   Hemoglobin 10.9 (L) 13.0 - 17.0 g/dL   HCT 21.3 (L) 08.6 - 57.8 %   MCV 92.7 80.0 - 100.0 fL   MCH 30.8 26.0 - 34.0 pg   MCHC 33.2 30.0 - 36.0 g/dL   RDW 46.9 (H) 62.9 - 52.8 %   Platelets 154 150 - 400 K/uL   nRBC 0.0 0.0 - 0.2 %   Neutrophils Relative % 55 %   Neutro Abs 8.0 (H) 1.7 - 7.7 K/uL   Lymphocytes Relative 35 %   Lymphs Abs 5.2 (H) 0.7 - 4.0 K/uL   Monocytes Relative 6 %   Monocytes Absolute 0.9 0.1 - 1.0 K/uL   Eosinophils Relative 3 %   Eosinophils Absolute 0.5 0.0 - 0.5 K/uL   Basophils Relative 0 %   Basophils Absolute 0.0 0.0 - 0.1 K/uL   RBC Morphology MIXED RBC POPULATION    Smear Review Normal platelet morphology    Immature Granulocytes 1 %   Abs Immature Granulocytes 0.15 (H) 0.00 - 0.07 K/uL    Comment: Performed at [facility], [address] [place], [place] [address]  Comprehensive metabolic panel     Status: Abnormal   Collection Time: 02/01/19  8:24 PM  Result Value Ref Range   Sodium 142 135 - 145 mmol/L   Potassium 5.1 3.5 - 5.1 mmol/L   Chloride 110 98 - 111 mmol/L   CO2 23 22 - 32 mmol/L   Glucose, Bld 104 (H) 70 - 99 mg/dL   BUN 22 8 - 23 mg/dL   Creatinine, Ser 2.67 (H) 0.61 - 1.24 mg/dL   Calcium 9.0 8.9 - 12.4 mg/dL   Total Protein 7.1 6.5 - 8.1 g/dL   Albumin 3.8 3.5 - 5.0 g/dL   AST 17 15 - 41 U/L   ALT 9 0 - 44 U/L   Alkaline Phosphatase 99 38 - 126 U/L   Total Bilirubin 0.5 0.3 - 1.2 mg/dL   GFR calc non Af Amer 50 (L) >60 mL/min   GFR calc Af Amer 57 (L) >60 mL/min   Anion gap 9 5 - 15    Comment: Performed at [facility], [address], [place], [place] [address]  Troponin I (High Sensitivity)     Status: None   Collection Time: 02/01/19  8:24 PM  Result Value Ref Range   Troponin I (High Sensitivity) 8 <18 ng/L     Comment: (NOTE) Elevated high sensitivity troponin I (hsTnI) values and significant  changes across serial measurements may suggest ACS but many other  chronic and acute conditions are known to elevate hsTnI results.  Refer to the "Links" section for chest pain algorithms and additional  guidance. Performed at [facility], [address] [place], [place] [address]   SARS Coronavirus 2 by RT PCR (hospital order, performed in [facility] hospital lab) Nasopharyngeal Nasopharyngeal Swab     Status: None   Collection Time: 02/01/19  8:24 PM   Specimen: Nasopharyngeal Swab  Result Value Ref Range   SARS Coronavirus 2 NEGATIVE NEGATIVE    Comment: (NOTE) If result is NEGATIVE SARS-CoV-2 target nucleic acids are NOT DETECTED. The SARS-CoV-2 RNA is generally detectable in upper and lower  respiratory specimens during the acute phase of infection. The lowest  concentration of SARS-CoV-2 viral copies this assay can detect is 250  copies / mL. A negative result does not preclude SARS-CoV-2 infection  and should not be used as the sole basis for treatment or other  patient management decisions.  A negative result may occur with  improper specimen collection / handling, submission of specimen other  than nasopharyngeal swab, presence of viral mutation(s) within the  areas targeted by this assay, and inadequate number of viral copies  (<250 copies / mL). A negative result must be combined with clinical  observations, patient history, and epidemiological information. If result is POSITIVE SARS-CoV-2 target nucleic acids are DETECTED. The SARS-CoV-2 RNA is generally detectable in upper and lower  respiratory specimens dur ing the acute phase of infection.  Positive  results are indicative of active infection with SARS-CoV-2.  Clinical  correlation with patient history and other diagnostic information is  necessary to determine patient infection status.  Positive results do  not rule  out bacterial infection or co-infection with other viruses. If result is PRESUMPTIVE POSTIVE SARS-CoV-2 nucleic acids MAY BE PRESENT.   A presumptive positive result was obtained on the submitted specimen  and confirmed on repeat testing.  While 2019 novel coronavirus  (SARS-CoV-2) nucleic acids may be present in the submitted sample  additional confirmatory testing  may be necessary for epidemiological  and / or clinical management purposes  to differentiate between  SARS-CoV-2 and other Sarbecovirus currently known to infect humans.  If clinically indicated additional testing with an alternate test  methodology [phone]([id number]) is advised. The SARS-CoV-2 RNA is generally  detectable in upper and lower respiratory sp ecimens during the acute  phase of infection. The expected result is Negative. Fact Sheet for Patients:  BoilerBrush.com.cyhttps://www.fda.gov/media/136312/download Fact Sheet for Healthcare Providers: https://pope.com/https://www.fda.gov/media/136313/download This test is not yet approved or cleared by the [place] FDA and has been authorized for detection and/or diagnosis of SARS-CoV-2 by FDA under an Emergency Use Authorization (EUA).  This EUA will remain in effect (meaning this test can be used) for the duration of the COVID-19 declaration under Section 564(b)(1) of the Act, 21 U.S.C. section 360bbb-3(b)(1), unless the authorization is terminated or revoked sooner. Performed at [facility], [address] [place], [place] [address]   Lactic acid, plasma     Status: None   Collection Time: 02/01/19  8:24 PM  Result Value Ref Range   Lactic Acid, Venous 1.4 0.5 - 1.9 mmol/L    Comment: Performed at [facility], [address] [place], [place] [address]  Urinalysis, Routine w reflex microscopic     Status: Abnormal   Collection Time: 02/01/19 11:16 PM  Result Value Ref Range   Color, Urine YELLOW (A) YELLOW   APPearance CLEAR (A) CLEAR   Specific Gravity, Urine 1.018 1.005 -  1.030   pH 5.0 5.0 - 8.0   Glucose, UA NEGATIVE NEGATIVE mg/dL   Hgb urine dipstick NEGATIVE NEGATIVE   Bilirubin Urine NEGATIVE NEGATIVE   Ketones, ur NEGATIVE NEGATIVE mg/dL   Protein, ur NEGATIVE NEGATIVE mg/dL   Nitrite NEGATIVE NEGATIVE   Leukocytes,Ua NEGATIVE NEGATIVE    Comment: Performed at [facility], [address] [place], [place] [address]  Troponin I (High Sensitivity)     Status: None   Collection Time: 02/01/19 11:16 PM  Result Value Ref Range   Troponin I (High Sensitivity) 8 <18 ng/L    Comment: (NOTE) Elevated high sensitivity troponin I (hsTnI) values and significant  changes across serial measurements may suggest ACS but many other  chronic and acute conditions are known to elevate hsTnI results.  Refer to the "Links" section for chest pain algorithms and additional  guidance. Performed at [facility], [address] [place], [place] [address]    Dg Chest 1 View  Result Date: 02/01/2019 CLINICAL DATA:  Chest pain EXAM: CHEST  1 VIEW COMPARISON:  03/03/2017 FINDINGS: Previous median sternotomy and CABG procedure. Normal heart size. Diffusely increased interstitial markings are noted bilaterally compatible with pulmonary edema. No airspace consolidation. IMPRESSION: 1. Mild diffuse pulmonary edema. Electronically Signed   By: Signa Kellaylor  Stroud M.D.   On: 02/01/2019 20:45    Pending Labs Unresulted Labs (From admission, onward)   None      Vitals/Pain Today's Vitals   02/01/19 2100 02/01/19 2200 02/01/19 2230 02/01/19 2330  BP: (!) 135/54  (!) 129/51 (!) 124/59  Pulse: 81  75 71  Resp: 16  18 16   Temp:    99.5 F (37.5 C)  TempSrc:      SpO2: 97%  97% 100%  Weight:      Height:      PainSc: 0-No pain 0-No pain 0-No pain 0-No pain    Isolation Precautions No active isolations  Medications Medications  lisinopril (ZESTRIL) tablet 40 mg (has no  administration in time range)  atorvastatin (LIPITOR) tablet 40 mg (has no  administration in time range)  diazepam (VALIUM) tablet 5 mg (has no administration in time range)  DULoxetine (CYMBALTA) DR capsule 30 mg (has no administration in time range)  gabapentin (NEURONTIN) capsule 100 mg (has no administration in time range)  guaiFENesin-dextromethorphan (ROBITUSSIN DM) 100-10 MG/5ML syrup 5 mL (has no administration in time range)  ipratropium-albuterol (DUONEB) 0.5-2.5 (3) MG/3ML nebulizer solution 3 mL (has no administration in time range)  loratadine (CLARITIN) tablet 10 mg (has no administration in time range)  acetaminophen (TYLENOL) tablet 650 mg (has no administration in time range)  ondansetron (ZOFRAN) injection 4 mg (has no administration in time range)  enoxaparin (LOVENOX) injection 40 mg (has no administration in time range)  zolpidem (AMBIEN) tablet 5 mg (has no administration in time range)  alum & mag hydroxide-simeth (MAALOX/MYLANTA) 200-200-20 MG/5ML suspension 30 mL (has no administration in time range)    And  lidocaine (XYLOCAINE) 2 % viscous mouth solution 15 mL (has no administration in time range)  aspirin EC tablet 325 mg (has no administration in time range)  morphine 2 MG/ML injection 2 mg (has no administration in time range)  furosemide (LASIX) injection 40 mg (has no administration in time range)  nitroGLYCERIN (NITROSTAT) SL tablet 0.4 mg (has no administration in time range)  aspirin chewable tablet 324 mg (324 mg Oral Given 02/01/19 2138)  ipratropium-albuterol (DUONEB) 0.5-2.5 (3) MG/3ML nebulizer solution 3 mL (3 mLs Nebulization Given 02/01/19 2333)  ipratropium-albuterol (DUONEB) 0.5-2.5 (3) MG/3ML nebulizer solution 3 mL (3 mLs Nebulization Given 02/01/19 2332)  ipratropium-albuterol (DUONEB) 0.5-2.5 (3) MG/3ML nebulizer solution 3 mL (3 mLs Nebulization Given 02/01/19 2332)  methylPREDNISolone sodium succinate (SOLU-MEDROL) 125 mg/2 mL injection 125 mg (125 mg Intravenous Given 02/01/19 2323)  furosemide (LASIX) injection 20  mg (20 mg Intravenous Given 02/01/19 2317)    Mobility walks with device Low fall risk   Focused Assessments Pulmonary Assessment Handoff:  Lung sounds:   O2 Device: Room Air        R Recommendations: See Admitting Provider Note  Report given to:   Additional Notes: n/a

## 2019-02-02 NOTE — Progress Notes (Signed)
*  PRELIMINARY RESULTS* Echocardiogram 2D Echocardiogram has been performed.  William Wilcox 02/02/2019, 12:01 PM

## 2019-02-02 NOTE — Discharge Instructions (Signed)
Smoking cessation  

## 2019-02-02 NOTE — TOC Initial Note (Addendum)
Transition of Care [facility]) - Initial/Assessment Note    Patient Details  Name: William Wilcox MRN: [id number] Date of Birth: 01-May-1940  [facility] CM/SW Contact:    Candie Chroman, LCSW Phone Number: 02/02/2019, 10:12 AM  Clinical Narrative: Per ED notes, patient was admitted from [facility]. CSW called Dustin at [employer] [employer] and he confirmed patient is an ALF resident. Patient uses a walker and is able to communicate at the facility. Patient smokes cigarettes there. He was recently discharged from [facility] but Rachel Bo said they could restart services if needed. CSW obtained fax number in case patient is a weekend discharge. Patient will need to go by EMS if he discharges over the weekend. Patient has a legal guardian through DSS: Tiffany Dunn. CSW left her a voicemail. No further concerns. CSW will continue to follow patient for support and facilitate return to ALF once stable.    10:57 am: Notified Rachel Bo that patient will discharge back today. Will fax discharge summary when available.      11:37 am: Faxed discharge summary to [facility]. Unable to reach anyone on the phone so CSW emailed him to see if they could pick him up around 2:00.  Expected Discharge Plan: Assisted Living Barriers to Discharge: Continued Medical Work up   Patient Goals and CMS Choice Patient states their goals for this hospitalization and ongoing recovery are:: Patient not fully oriented.   Choice offered to / list presented to : NA  Expected Discharge Plan and Services Expected Discharge Plan: Assisted Living       Living arrangements for the past 2 months: [place]                                      Prior Living Arrangements/Services Living arrangements for the past 2 months: [place] Lives with:: Facility Resident Patient language and need for interpreter reviewed:: Yes Do you feel safe going back to the place where you live?: Yes       Need for Family Participation in Patient Care: Yes (Comment) Care giver support system in place?: Yes (comment) Current home services: DME Criminal Activity/Legal Involvement Pertinent to Current Situation/Hospitalization: No - Comment as needed  Activities of Daily Living Home Assistive Devices/Equipment: Gilford Rile (specify type) ADL Screening (condition at time of admission) Patient's cognitive ability adequate to safely complete daily activities?: Yes Is the patient deaf or have difficulty hearing?: No Does the patient have difficulty seeing, even when wearing glasses/contacts?: No Does the patient have difficulty concentrating, remembering, or making decisions?: No Patient able to express need for assistance with ADLs?: Yes Does the patient have difficulty dressing or bathing?: No Independently performs ADLs?: Yes (appropriate for developmental age) Does the patient have difficulty walking or climbing stairs?: No Weakness of Legs: None Weakness of Arms/Hands: None  Permission Sought/Granted Permission sought to share information with : Facility Sport and exercise psychologist    Share Information with NAME: Milana Kidney  Permission granted to share info w AGENCY: [facility]  Permission granted to share info w Relationship: Legal Guardian  Permission granted to share info w Contact Information: Work- [phone]  Cell- [phone]  Emotional Assessment Appearance:: Appears stated age Attitude/Demeanor/Rapport: Unable to Assess Affect (typically observed): Unable to Assess Orientation: : Oriented to Self, Oriented to Place Alcohol / Substance Use: Tobacco Use Psych Involvement: No (comment)  Admission diagnosis:  Chest pain, unspecified  type [R07.9] Patient Active Problem List   Diagnosis Date Noted  . Chest pain 02/01/2019  . Leg pain 02/09/2017  . Lower extremity pain, bilateral 10/04/2016  . Dyslipidemia 04/14/2015  . Tobacco use disorder 04/14/2015  . Major depressive  disorder, recurrent severe without psychotic features (HCC) 04/14/2015  . Major depressive disorder, single episode, severe without psychotic features (HCC) 04/13/2015  . COPD (chronic obstructive pulmonary disease) (HCC) 04/13/2015  . Chronic hip pain 04/13/2015  . Hypertension 04/13/2015   PCP:  [facility] Pharmacy:  No Pharmacies Listed    Social Determinants of Health (SDOH) Interventions    Readmission Risk Interventions No flowsheet data found.

## 2019-02-02 NOTE — TOC Transition Note (Signed)
Transition of Care [facility]) - CM/SW Discharge Note   Patient Details  Name: ELYJAH HAZAN MRN: [id number] Date of Birth: 04-21-1940  Transition of Care [facility]) CM/SW Contact:  Candie Chroman, LCSW Phone Number: 02/02/2019, 12:06 PM   Clinical Narrative: ALF confirmed receipt of discharge summary. They will pick him up at 2:00. RN aware. No further concerns. CSW signing off.    Final next level of care: Assisted Living Barriers to Discharge: Barriers Resolved   Patient Goals and CMS Choice Patient states their goals for this hospitalization and ongoing recovery are:: Patient not fully oriented.   Choice offered to / list presented to : NA  Discharge Placement                Patient to be transferred to facility by: ALF staff will pick him up. Name of family member notified: Left vm for legal guardian, Milana Kidney. Also left voicemail for another APS social worker Restaurant manager, fast food) to notify. Patient and family notified of of transfer: 02/02/19  Discharge Plan and Services                                     Social Determinants of Health (SDOH) Interventions     Readmission Risk Interventions No flowsheet data found.

## 2019-02-02 NOTE — Plan of Care (Signed)
Up to bathroom without complaints of pain or SOB.

## 2019-02-02 NOTE — Consult Note (Addendum)
[facility]  CARDIOLOGY CONSULT NOTE  Patient ID: William Wilcox MRN: [id number] DOB/AGE: 1940-10-22 78 y.o.  Admit date: 02/01/2019 Referring Physician Dr. Imogene Burnhen Primary Physician [facility] - None? Reason for Consultation Chest pain  HPI:  William Wilcox is a 78 y.o. 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 The pain was located on his left side without radiation elsewhere. He is unable to characterize the quality of pain other than to state that it felt like a "weird sensation". It lasted about 30 minutes and resolved spontaneously. He denies having any other symptoms with the pain - no diaphoresis, nausea, vomiting, shortness of breath, or lightheadedness. He had been in his usual state of health prior to yesterday without any recent illnesses, cough, fever, or change in his breathing. He has not had any chest pain since admission and he states that he feels well currently. He does not remember if he has experienced similar chest pain in the past. He denies any recent leg swelling, orthopnea, lightheadedness, or syncope.   ER work up included three high sensitivity troponin levels, which were all normal. CXR with showed mild diffuse pulmonary edema. He has received 40 mg of IV lasix.   Of note, patient has no known history of heart dysfunction.   Review of systems complete and found to be negative unless listed above   Past Medical History:  Diagnosis Date  . COPD (chronic obstructive pulmonary disease) (HCC)   . Coronary artery disease   . Hypertension   . MI (myocardial infarction) [facility](HCC)     Past Surgical History:  Procedure Laterality Date  . BACK SURGERY    . CARDIAC SURGERY     triple bypass  . HIP SURGERY Left     Medications Prior to Admission   Medication Sig Dispense Refill Last Dose  . acetaminophen (TYLENOL) 325 MG tablet Take 650 mg by mouth.   unknown at unknown  . albuterol (PROVENTIL HFA;VENTOLIN HFA) 108 (90 Base) MCG/ACT inhaler Inhale into the lungs.   prn at prn  . ALPRAZolam (XANAX) 0.25 MG tablet Take 0.25 mg by mouth at bedtime as needed for anxiety.   prn at prn  . alum & mag hydroxide-simeth (MAALOX/MYLANTA) 200-200-20 MG/5ML suspension Take 30 mLs by mouth every 6 (six) hours as needed for indigestion or heartburn.   prn at prn  . ammonium lactate (LAC-HYDRIN) 12 % lotion Apply 1 application topically as needed for dry skin.   prn at prn  . bacitracin-polymyxin b (POLYSPORIN) ointment Apply topically 2 (two) times daily.   unknowb at unknown  . diphenhydrAMINE (BENADRYL) 25 MG tablet Take 25 mg by mouth every 6 (six) hours as needed.   prn at prn  . EPINEPHrine 0.3 mg/0.3 mL IJ SOAJ injection Inject 0.3 mg into the muscle as needed for anaphylaxis.   prn at prn  . guaiFENesin-dextromethorphan (ROBITUSSIN DM) 100-10 MG/5ML syrup Take 5 mLs by mouth.   prn at prn  . ibuprofen (ADVIL,MOTRIN) 400 MG tablet Take 1 tablet (400 mg total) by mouth every 8 (eight) hours as needed. 30 tablet 0 prn at prn  . Ipratropium-Albuterol (COMBIVENT RESPIMAT) 20-100 MCG/ACT AERS respimat Inhale 1 puff into the lungs every 6 (six) hours.   unknown at unknown  . ipratropium-albuterol (DUONEB) 0.5-2.5 (3)  MG/3ML SOLN Take 3 mLs by nebulization every 4 (four) hours as needed.   prn at prn  . lisinopril (PRINIVIL,ZESTRIL) 40 MG tablet Take 40 mg by mouth daily.   unknown at unknown  . loperamide (IMODIUM) 2 MG capsule Take 2 mg by mouth as needed for diarrhea or loose stools.   prn at prn  . magnesium hydroxide (MILK OF MAGNESIA) 400 MG/5ML suspension Take 30 mLs by mouth daily as needed for mild constipation.   prn at prn  . multivitamin-lutein (OCUVITE-LUTEIN) CAPS capsule Take 1 capsule by mouth daily.   unknown at unknown  . nitroGLYCERIN  (NITROSTAT) 0.4 MG SL tablet Place 0.4 mg under the tongue every 5 (five) minutes as needed for chest pain.   prn at prn  . NON FORMULARY Apply 1 application topically 2 (two) times daily. Benadryl cream 2%   unknown at unknown  . OLANZapine (ZYPREXA) 2.5 MG tablet Take 2.5 mg by mouth at bedtime.   unknown at unknown  . prazosin (MINIPRESS) 5 MG capsule Take 5 mg by mouth at bedtime.   unknown at unknown  . sertraline (ZOLOFT) 50 MG tablet Take 50 mg by mouth daily.   unknown at unknown  . simvastatin (ZOCOR) 40 MG tablet Take 40 mg by mouth every evening.    unknown at unknown  . tiotropium (SPIRIVA) 18 MCG inhalation capsule Place 18 mcg into inhaler and inhale daily.   unknown at unknown  . traMADol (ULTRAM) 50 MG tablet Take 50 mg by mouth every 12 (twelve) hours as needed.   prn at prn  . traZODone (DESYREL) 50 MG tablet Take 50 mg by mouth at bedtime as needed for sleep.   prn at prn  . triamcinolone cream (KENALOG) 0.1 % Apply 1 application topically 2 (two) times daily.    unknown at unknown  . trolamine salicylate (ASPERCREME) 10 % cream Apply 1 application topically as needed for muscle pain.   prn at prn  . white petrolatum ointment Apply 1 application topically daily.   unknown at unknown   Social History   Socioeconomic History  . Marital status: Widowed    Spouse name: Not on file  . Number of children: Not on file  . Years of education: Not on file  . Highest education level: Not on file  Occupational History  . Not on file  Social Needs  . Financial resource strain: Not on file  . Food insecurity    Worry: Not on file    Inability: Not on file  . Transportation needs    Medical: Not on file    Non-medical: Not on file  Tobacco Use  . Smoking status: Current Every Day Smoker    Packs/day: 2.00    Years: 57.00    Pack years: 114.00    Types: Cigarettes  . Smokeless tobacco: Never Used  Substance and Sexual Activity  . Alcohol use: No  . Drug use: No  . Sexual  activity: Not on file  Lifestyle  . Physical activity    Days per week: Not on file    Minutes per session: Not on file  . Stress: Not on file  Relationships  . Social Musician on phone: Not on file    Gets together: Not on file    Attends religious service: Not on file    Active member of club or organization: Not on file    Attends meetings of clubs or organizations: Not on file  Relationship status: Not on file  . Intimate partner violence    Fear of current or ex partner: Not on file    Emotionally abused: Not on file    Physically abused: Not on file    Forced sexual activity: Not on file  Other Topics Concern  . Not on file  Social History Narrative  . Not on file    No family history on file.   Review of systems complete and found to be negative unless listed above    PHYSICAL EXAM  General: Frail appearing. Well developed, well nourished. HEENT:  Normocephalic and atramatic Neck:  No JVD.  Lungs: Breathing comfortably on room air. Scattered wheezing throughout. Inspiratory crackles to bilaterally bases.  Heart: HRRR . Normal S1 and S2 without gallops or murmurs. . Extremities: No clubbing, cyanosis or edema.   Neuro: Alert and oriented X 3. Psych:  Good affect, responds appropriately  Labs:   Lab Results  Component Value Date   WBC 14.8 (H) 02/01/2019   HGB 10.9 (L) 02/01/2019   HCT 32.8 (L) 02/01/2019   MCV 92.7 02/01/2019   PLT 154 02/01/2019    Recent Labs  Lab 02/01/19 2024  NA 142  K 5.1  CL 110  CO2 23  BUN 22  CREATININE 1.35*  CALCIUM 9.0  PROT 7.1  BILITOT 0.5  ALKPHOS 99  ALT 9  AST 17  GLUCOSE 104*   Lab Results  Component Value Date   TROPONINI <0.03 10/03/2017    Lab Results  Component Value Date   CHOL 111 02/02/2019   Lab Results  Component Value Date   HDL 34 (L) 02/02/2019   Lab Results  Component Value Date   LDLCALC 65 02/02/2019   Lab Results  Component Value Date   TRIG 62 02/02/2019    Lab Results  Component Value Date   CHOLHDL 3.3 02/02/2019   No results found for: LDLDIRECT    Radiology: Dg Chest 1 View  Result Date: 02/01/2019 CLINICAL DATA:  Chest pain EXAM: CHEST  1 VIEW COMPARISON:  03/03/2017 FINDINGS: Previous median sternotomy and CABG procedure. Normal heart size. Diffusely increased interstitial markings are noted bilaterally compatible with pulmonary edema. No airspace consolidation. IMPRESSION: 1. Mild diffuse pulmonary edema. Electronically Signed   By: Kerby Moors M.D.   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 He reports feeling well this morning and not having any pain currently. He did have mild diffuse pulmonary edema on his CXR from the ER. He has received IV lasix. Patient is without any signs of fluid overload on exam currently. Patient has no known history of heart dysfunction, although unclear when he last had an ECHO. He has not seen cardiology in many years.   #Chest pain, History of coronary artery disease s/p CABG (unknown bypass anatomy):  Three high sensitivity troponin levels have been normal. EKG without findings suggestive of myocardial ischemia. Patient is currently chest pain free. Consider possible ischemic workup in the outpatient setting but no further cardiac interventions or diagnostics indicated at this time for his chest  pain.  - Close outpatient cardiology follow up with Dr. Gwen Pounds in 1-2 weeks   #Pulmonary edema:  CXR with evidence of mild diffuse pulmonary edema. He has no known history of heart failure. No BNP available.  He has no signs of fluid overload on exam currently.  - Follow up results of ECHO  The patient's history and exam findings were discussed with Dr. Gwen Pounds. The plan was made in conjunction with Dr. Gwen Pounds.  Signed: Harrell Gave PA-C 02/02/2019, 7:38 AM   The patient has been interviewed and examined. I agree with assessment and plan above. Arnoldo Hooker MD [facility]

## 2019-02-18 ENCOUNTER — Emergency Department
Admission: EM | Admit: 2019-02-18 | Discharge: 2019-02-18 | Disposition: A | Discharge status: Other Acute Inpatient Hospital | Payer: Medicare Other | Attending: Emergency Medicine | Admitting: Emergency Medicine

## 2019-02-18 ENCOUNTER — Emergency Department: Payer: Medicare Other

## 2019-02-18 ENCOUNTER — Other Ambulatory Visit: Payer: Self-pay

## 2019-02-18 ENCOUNTER — Encounter: Payer: Self-pay | Admitting: Emergency Medicine

## 2019-02-18 DIAGNOSIS — I10 Essential (primary) hypertension: Secondary | ICD-10-CM | POA: Diagnosis not present

## 2019-02-18 DIAGNOSIS — Z7982 Long term (current) use of aspirin: Secondary | ICD-10-CM | POA: Insufficient documentation

## 2019-02-18 DIAGNOSIS — Z79899 Other long term (current) drug therapy: Secondary | ICD-10-CM | POA: Diagnosis not present

## 2019-02-18 DIAGNOSIS — J449 Chronic obstructive pulmonary disease, unspecified: Secondary | ICD-10-CM | POA: Diagnosis not present

## 2019-02-18 DIAGNOSIS — R1084 Generalized abdominal pain: Secondary | ICD-10-CM | POA: Insufficient documentation

## 2019-02-18 DIAGNOSIS — E860 Dehydration: Secondary | ICD-10-CM

## 2019-02-18 DIAGNOSIS — R531 Weakness: Secondary | ICD-10-CM | POA: Diagnosis present

## 2019-02-18 DIAGNOSIS — M6281 Muscle weakness (generalized): Secondary | ICD-10-CM | POA: Diagnosis not present

## 2019-02-18 DIAGNOSIS — F17210 Nicotine dependence, cigarettes, uncomplicated: Secondary | ICD-10-CM | POA: Insufficient documentation

## 2019-02-18 LAB — COMPREHENSIVE METABOLIC PANEL WITH GFR
ALT: 9 U/L (ref 0–44)
AST: 16 U/L (ref 15–41)
Albumin: 3.7 g/dL (ref 3.5–5.0)
Alkaline Phosphatase: 97 U/L (ref 38–126)
Anion gap: 10 (ref 5–15)
BUN: 22 mg/dL (ref 8–23)
CO2: 23 mmol/L (ref 22–32)
Calcium: 9.3 mg/dL (ref 8.9–10.3)
Chloride: 109 mmol/L (ref 98–111)
Creatinine, Ser: 1.48 mg/dL — ABNORMAL HIGH (ref 0.61–1.24)
GFR calc Af Amer: 51 mL/min — ABNORMAL LOW
GFR calc non Af Amer: 44 mL/min — ABNORMAL LOW
Glucose, Bld: 114 mg/dL — ABNORMAL HIGH (ref 70–99)
Potassium: 6 mmol/L — ABNORMAL HIGH (ref 3.5–5.1)
Sodium: 142 mmol/L (ref 135–145)
Total Bilirubin: 0.4 mg/dL (ref 0.3–1.2)
Total Protein: 7.1 g/dL (ref 6.5–8.1)

## 2019-02-18 LAB — CBC WITH DIFFERENTIAL/PLATELET
Abs Immature Granulocytes: 0.11 K/uL — ABNORMAL HIGH (ref 0.00–0.07)
Basophils Absolute: 0 K/uL (ref 0.0–0.1)
Basophils Relative: 0 %
Eosinophils Absolute: 0.5 K/uL (ref 0.0–0.5)
Eosinophils Relative: 4 %
HCT: 34.4 % — ABNORMAL LOW (ref 39.0–52.0)
Hemoglobin: 11.5 g/dL — ABNORMAL LOW (ref 13.0–17.0)
Immature Granulocytes: 1 %
Lymphocytes Relative: 36 %
Lymphs Abs: 3.8 K/uL (ref 0.7–4.0)
MCH: 30.8 pg (ref 26.0–34.0)
MCHC: 33.4 g/dL (ref 30.0–36.0)
MCV: 92.2 fL (ref 80.0–100.0)
Monocytes Absolute: 0.6 K/uL (ref 0.1–1.0)
Monocytes Relative: 6 %
Neutro Abs: 5.5 K/uL (ref 1.7–7.7)
Neutrophils Relative %: 53 %
Platelets: 154 K/uL (ref 150–400)
RBC: 3.73 MIL/uL — ABNORMAL LOW (ref 4.22–5.81)
RDW: 16.5 % — ABNORMAL HIGH (ref 11.5–15.5)
WBC: 10.5 K/uL (ref 4.0–10.5)
nRBC: 0 % (ref 0.0–0.2)

## 2019-02-18 LAB — BASIC METABOLIC PANEL
Anion gap: 7 (ref 5–15)
BUN: 20 mg/dL (ref 8–23)
CO2: 21 mmol/L — ABNORMAL LOW (ref 22–32)
Calcium: 8.3 mg/dL — ABNORMAL LOW (ref 8.9–10.3)
Chloride: 107 mmol/L (ref 98–111)
Creatinine, Ser: 1.44 mg/dL — ABNORMAL HIGH (ref 0.61–1.24)
GFR calc Af Amer: 53 mL/min — ABNORMAL LOW (ref >60)
GFR calc non Af Amer: 46 mL/min — ABNORMAL LOW (ref >60)
Glucose, Bld: 98 mg/dL (ref 70–99)
Potassium: 5.4 mmol/L — ABNORMAL HIGH (ref 3.5–5.1)
Sodium: 135 mmol/L (ref 135–145)

## 2019-02-18 LAB — TROPONIN I (HIGH SENSITIVITY)
Troponin I (High Sensitivity): 5 ng/L
Troponin I (High Sensitivity): 5 ng/L (ref <18)

## 2019-02-18 LAB — LIPASE, BLOOD: Lipase: 38 U/L (ref 11–51)

## 2019-02-18 LAB — ETHANOL: Alcohol, Ethyl (B): <10 mg/dL (ref <10)

## 2019-02-18 MED ORDER — IOHEXOL 300 MG/ML  SOLN
75.0000 mL | Freq: Once | INTRAMUSCULAR | Status: AC | PRN
  Administered 2019-02-18: 75 mL via INTRAVENOUS

## 2019-02-18 MED ORDER — SODIUM CHLORIDE 0.9 % IV BOLUS
1000.0000 mL | Freq: Once | INTRAVENOUS | Status: AC
  Administered 2019-02-18: 17:00:00 1000 mL via INTRAVENOUS

## 2019-02-18 NOTE — Discharge Instructions (Addendum)
Your lab tests show some mild dehydration.  We gave you IV fluids for this.  Your CT scan of the abdomen was unremarkable.  Continue taking all your medications, increase your fluid intake, be careful to eat regularly, and follow-up with your primary care doctor.

## 2019-02-18 NOTE — ED Provider Notes (Signed)
[facility] Emergency Department Provider Note  ____________________________________________  Time seen: Approximately 5:53 PM  I have reviewed the triage vital signs and the nursing notes.   HISTORY  Chief Complaint Weakness    HPI William Wilcox is a 78 y.o. male with a history of COPD CAD hypertension who was sent to the ED from the [facility] complaining of shortness of breath and weakness that started around lunchtime today.  Patient is unable to pinpoint.  He was in his usual state of health when he woke up today.  Reports decreased appetite and malaise.  Also has generalized abdominal pain which is nonradiating and without aggravating or alleviating factors.  He reports that eating does not cause his abdomen to hurt.  Denies chest pain or dizziness.      Past Medical History:  Diagnosis Date  . COPD (chronic obstructive pulmonary disease) ([place])   . Coronary artery disease   . Hypertension   . MI (myocardial infarction) [facility])      Patient Active Problem List   Diagnosis Date Noted  . Chest pain 02/01/2019  . Leg pain 02/09/2017  . Lower extremity pain, bilateral 10/04/2016  . Dyslipidemia 04/14/2015  . Tobacco use disorder 04/14/2015  . Major depressive disorder, recurrent severe without psychotic features ([place]) 04/14/2015  . Major depressive disorder, single episode, severe without psychotic features ([place]) 04/13/2015  . COPD (chronic obstructive pulmonary disease) ([place]) 04/13/2015  . Chronic hip pain 04/13/2015  . Hypertension 04/13/2015     Past Surgical History:  Procedure Laterality Date  . BACK SURGERY    . CARDIAC SURGERY     triple bypass  . HIP SURGERY Left      Prior to Admission medications   Medication Sig Start Date End Date Taking? Authorizing Provider  acetaminophen (TYLENOL) 325 MG tablet Take 650 mg by mouth.    [provider]  albuterol (PROVENTIL HFA;VENTOLIN HFA) 108 (90 Base) MCG/ACT inhaler Inhale into the  lungs.    [provider]  ALPRAZolam Duanne Moron) 0.25 MG tablet Take 0.25 mg by mouth at bedtime as needed for anxiety.    [provider]  alum & mag hydroxide-simeth (MAALOX/MYLANTA) 200-200-20 MG/5ML suspension Take 30 mLs by mouth every 6 (six) hours as needed for indigestion or heartburn.    [provider]  ammonium lactate (LAC-HYDRIN) 12 % lotion Apply 1 application topically as needed for dry skin.    [provider]  aspirin EC 81 MG tablet Take 1 tablet (81 mg total) by mouth daily. 02/02/19 02/02/20  Demetrios Loll, MD  bacitracin-polymyxin b (POLYSPORIN) ointment Apply topically 2 (two) times daily.    [provider]  diphenhydrAMINE (BENADRYL) 25 MG tablet Take 25 mg by mouth every 6 (six) hours as needed.    [provider]  EPINEPHrine 0.3 mg/0.3 mL IJ SOAJ injection Inject 0.3 mg into the muscle as needed for anaphylaxis.    [provider]  guaiFENesin-dextromethorphan (ROBITUSSIN DM) 100-10 MG/5ML syrup Take 5 mLs by mouth.    [provider]  Ipratropium-Albuterol (COMBIVENT RESPIMAT) 20-100 MCG/ACT AERS respimat Inhale 1 puff into the lungs every 6 (six) hours.    [provider]  ipratropium-albuterol (DUONEB) 0.5-2.5 (3) MG/3ML SOLN Take 3 mLs by nebulization every 4 (four) hours as needed.    [provider]  lisinopril (PRINIVIL,ZESTRIL) 40 MG tablet Take 40 mg by mouth daily.    [provider]  loperamide (IMODIUM) 2 MG capsule Take 2 mg by mouth  as needed for diarrhea or loose stools.    [provider]  magnesium hydroxide (MILK OF MAGNESIA) 400 MG/5ML suspension Take 30 mLs by mouth daily as needed for mild constipation.    [provider]  multivitamin-lutein (OCUVITE-LUTEIN) CAPS capsule Take 1 capsule by mouth daily.    [provider]  nitroGLYCERIN (NITROSTAT) 0.4 MG SL tablet Place 0.4 mg under the tongue every 5 (five) minutes as needed for chest  pain.    [provider]  NON FORMULARY Apply 1 application topically 2 (two) times daily. Benadryl cream 2%    [provider]  OLANZapine (ZYPREXA) 2.5 MG tablet Take 2.5 mg by mouth at bedtime.    [provider]  prazosin (MINIPRESS) 5 MG capsule Take 5 mg by mouth at bedtime.    [provider]  sertraline (ZOLOFT) 50 MG tablet Take 50 mg by mouth daily.    [provider]  simvastatin (ZOCOR) 40 MG tablet Take 40 mg by mouth every evening.     [provider]  tiotropium (SPIRIVA) 18 MCG inhalation capsule Place 18 mcg into inhaler and inhale daily.    [provider]  traMADol (ULTRAM) 50 MG tablet Take 50 mg by mouth every 12 (twelve) hours as needed.    [provider]  traZODone (DESYREL) 50 MG tablet Take 50 mg by mouth at bedtime as needed for sleep.    [provider]  triamcinolone cream (KENALOG) 0.1 % Apply 1 application topically 2 (two) times daily.     [provider]  trolamine salicylate (ASPERCREME) 10 % cream Apply 1 application topically as needed for muscle pain.    [provider]  white petrolatum ointment Apply 1 application topically daily.    [provider]     Allergies Patient has no known allergies.   History reviewed. No pertinent family history.  Social History Social History   Tobacco Use  . Smoking status: Current Every Day Smoker    Packs/day: 2.00    Years: 57.00    Pack years: 114.00    Types: Cigarettes  . Smokeless tobacco: Never Used  Substance Use Topics  . Alcohol use: No  . Drug use: No    Review of Systems  Constitutional:   No fever or chills.  ENT:   No sore throat. No rhinorrhea. Cardiovascular:   No chest pain or syncope. Respiratory:   Positive shortness of breath without cough. Gastrointestinal: Positive abdominal pain as above without vomiting and diarrhea.  Musculoskeletal:   Negative for focal pain or  swelling All other systems reviewed and are negative except as documented above in ROS and HPI.  ____________________________________________   PHYSICAL EXAM:  VITAL SIGNS: ED Triage Vitals  Enc Vitals Group     BP 02/18/19 1619 (!) 143/50     Pulse Rate 02/18/19 1619 76     Resp 02/18/19 1619 20     Temp 02/18/19 1619 98.2 F (36.8 C)     Temp Source 02/18/19 1619 Oral     SpO2 02/18/19 1619 97 %     Weight 02/18/19 1620 134 lb 3.2 oz (60.9 kg)     Height 02/18/19 1620  (1.651 m)     Head Circumference --      Peak Flow --      Pain Score 02/18/19 1620 0     Pain Loc --      Pain Edu? --      Excl. in  GC? --     Vital signs reviewed, nursing assessments reviewed.   Constitutional:   Alert and oriented. Non-toxic appearance. Eyes:   Conjunctivae are normal. EOMI. PERRL. ENT      Head:   Normocephalic and atraumatic.      Nose: Normal      Mouth/Throat:   Dry mucous membranes .      Neck:   No meningismus. Full ROM. Hematological/Lymphatic/Immunilogical:   No cervical lymphadenopathy. Cardiovascular:   RRR. Symmetric bilateral radial and DP pulses.  No murmurs. Cap refill less than 2 seconds. Respiratory:   Normal respiratory effort without tachypnea/retractions. Breath sounds are clear and equal bilaterally. No wheezes/rales/rhonchi. Gastrointestinal:   Soft with generalized tenderness, nonfocal. Non distended. There is no CVA tenderness.  No rebound, rigidity, or guarding. Musculoskeletal:   Normal range of motion in all extremities. No joint effusions.  No lower extremity tenderness.  No edema. Neurologic:   Normal speech and language.  Motor grossly intact. No acute focal neurologic deficits are appreciated.  Skin:    Skin is warm, dry and intact. No rash noted.  No petechiae, purpura, or bullae.  ____________________________________________    LABS (pertinent positives/negatives) (all labs ordered are listed, but only abnormal results are displayed) Labs  Reviewed  COMPREHENSIVE METABOLIC PANEL - Abnormal; Notable for the following components:      Result Value   Potassium 6.0 (*)    Glucose, Bld 114 (*)    Creatinine, Ser 1.48 (*)    GFR calc non Af Amer 44 (*)    GFR calc Af Amer 51 (*)    All other components within normal limits  CBC WITH DIFFERENTIAL/PLATELET - Abnormal; Notable for the following components:   RBC 3.73 (*)    Hemoglobin 11.5 (*)    HCT 34.4 (*)    RDW 16.5 (*)    Abs Immature Granulocytes 0.11 (*)    All other components within normal limits  BASIC METABOLIC PANEL - Abnormal; Notable for the following components:   Potassium 5.4 (*)    CO2 21 (*)    Creatinine, Ser 1.44 (*)    Calcium 8.3 (*)    GFR calc non Af Amer 46 (*)    GFR calc Af Amer 53 (*)    All other components within normal limits  LIPASE, BLOOD  ETHANOL  TROPONIN I (HIGH SENSITIVITY)  TROPONIN I (HIGH SENSITIVITY)   ____________________________________________   EKG    ____________________________________________    RADIOLOGY  Ct Abdomen Pelvis W Contrast  Result Date: 02/18/2019 CLINICAL DATA:  Acute generalized abdominal pain EXAM: CT ABDOMEN AND PELVIS WITH CONTRAST TECHNIQUE: Multidetector CT imaging of the abdomen and pelvis was performed using the standard protocol following bolus administration of intravenous contrast. CONTRAST:  75mL OMNIPAQUE IOHEXOL 300 MG/ML  SOLN COMPARISON:  CT abdomen pelvis 04/02/2007 FINDINGS: Lower chest: Progressive subpleural reticular changes in the lung bases with bronchiectatic change. 8 mm sub solid nodule present in the right lower lobe size may be exaggerated by respiratory motion artifact. Normal heart size. No pericardial effusion. Hepatobiliary: Few punctate calcified granulomata within the liver. No concerning focal liver lesion. Gallbladder contains multiple dependently layering calcified gallstones. No pericholecystic fluid or inflammation. No calcified intraductal gallstones. Pancreas:  Unremarkable. No pancreatic ductal dilatation or surrounding inflammatory changes. Spleen: Borderline splenomegaly. No focal splenic lesion. Adrenals/Urinary Tract: Normal adrenal glands. Multiple fluid attenuation cysts throughout both kidneys, largest in the right measuring 3.7 cm, largest in the left measuring 1.9 cm. Additional subcentimeter hypoattenuating  foci are too small to fully characterize on CT imaging but statistically likely benign. Punctate calcification noted in the interpolar left kidney. No suspicious renal lesions. Mild bilateral symmetric perinephric stranding, a nonspecific finding though may correlate with either age or decreased renal function. No obstructive urolithiasis or hydronephrosis. Urinary bladder is grossly normal though partially obscured by streak artifact from patient's left hip arthroplasty. Stomach/Bowel: Distal esophagus, stomach and duodenal sweep are unremarkable. No small bowel wall thickening or dilatation. No evidence of obstruction. A normal appendix is visualized. Scattered colonic diverticula without focal pericolonic inflammation to suggest diverticulitis. No colonic dilatation or wall thickening. Portion of the distal colon partially obscured by streak artifact in the pelvis. Vascular/Lymphatic: Extensive severe atherosclerotic calcification of the abdominal aorta and branch vessels. Irregular aneurysmal dilatation of the infrarenal abdominal aorta measuring up to 3.5 cm in maximal diameter with calcified linear septations and eccentric mural thrombus within the aneurysmal segment suggesting a remote or chronic dissection. No suspicious or enlarged lymph nodes in the included lymphatic chains. Reproductive: Mild prostatomegaly, difficult to fully visualize prostate given streak artifact left hip prosthesis. Other: No abdominopelvic free fluid or free gas. No bowel containing hernias. Musculoskeletal: Prior total left hip arthroplasty in expected alignment with  resulting streak artifact. The osseous structures appear diffusely demineralized which may limit detection of small or nondisplaced fractures. Likely remote posttraumatic deformity of the left inferior pubic ramus and both pubic bodies possibly the right L4 transverse process as well. Multilevel degenerative changes are present in the imaged portions of the spine. Mild levocurvature of the lumbar spine apex at L4. Multilevel Schmorl's node formations are seen. No acute osseous abnormality or suspicious osseous lesion. IMPRESSION: 1. No acute intra-abdominal process to provide cause for patient's current symptoms. 2. 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 No acute periaortic abnormality or inflammation. Recommend outpatient vascular consultation if not previously considered, and at minimum recommend followup by ultrasound in 2 years. This recommendation follows ACR consensus guidelines: White Paper of the ACR Incidental Findings Committee II on Vascular Findings. J Am Coll Radiol 2013; 10:789-794. Aortic aneurysm NOS (ICD10-I71.9) 3. Cholelithiasis without evidence of acute cholecystitis. 4. Diverticulosis without evidence acute diverticulitis. 5. 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 6. Progressive reticular changes in the lung bases with bronchiectatic change, suggestive of interstitial lung disease. Could be further evaluated with dedicated outpatient chest CT as clinically warranted. 7. 8 mm sub solid nodule in the right lower lobe size may be exaggerated by respiratory motion artifact. Follow-up non-contrast CT recommended at 3-6 months to confirm persistence. If unchanged, and solid component remains <6 mm, annual CT is recommended until 5 years of stability has been established. If persistent these nodules should be considered highly suspicious if the solid component of the nodule is 6  mm or greater in size and enlarging. This recommendation follows the consensus statement: Guidelines for Management of Incidental Pulmonary Nodules Detected on CT Images: From the Fleischner Society 2017; Radiology 2017; 284:228-243. 8. Aortic Atherosclerosis (ICD10-I70.0). Electronically Signed   By: Kreg Shropshire M.D.   On: 02/18/2019 18:42    ____________________________________________   PROCEDURES Procedures  ____________________________________________  DIFFERENTIAL DIAGNOSIS   Gastritis, pancreatitis, biliary disease, bowel obstruction, perforation, intra-abdominal abscess.  Less likely, but possible Covid.  Doubt mesenteric ischemia dissection or AAA.  CLINICAL IMPRESSION / ASSESSMENT AND PLAN / ED COURSE  Medications ordered in the ED: Medications  sodium chloride 0.9 % bolus  1,000 mL (0 mLs Intravenous Stopped 02/18/19 1751)  iohexol (OMNIPAQUE) 300 MG/ML solution 75 mL (75 mLs Intravenous Contrast Given 02/18/19 1804)    Pertinent labs & imaging results that were available during my care of the patient were reviewed by me and considered in my medical decision making (see chart for details).  William Wilcox was evaluated in Emergency Department on 02/18/2019 for the symptoms described in the history of present illness. He was evaluated in the context of the global COVID-19 pandemic, which necessitated consideration that the patient might be at risk for infection with the SARS-CoV-2 virus that causes COVID-19. Institutional protocols and algorithms that pertain to the evaluation of patients at risk for COVID-19 are in a state of rapid change based on information released by regulatory bodies including the CDC and federal and state organizations. These policies and algorithms were followed during the patient's care in the ED.   Patient presents with generalized weakness.  No lateralizing symptoms.  Check labs, CT scan of the abdomen and pelvis.  IV fluids for hydration as he clinically  appears dehydrated.  Patient's not septic on arrival.  No evidence of stroke.  Clinical Course as of Feb 17 2002  Wynelle Link Feb 18, 2019  2000 Labs improved, troponin stable, CT negative, stable for discharge home and outpatient follow-up.   [PS]    Clinical Course User Index [PS] Sharman Cheek, MD     ____________________________________________   FINAL CLINICAL IMPRESSION(S) / ED DIAGNOSES    Final diagnoses:  Generalized weakness  Dehydration     ED Discharge Orders    None      Portions of this note were generated with dragon dictation software. Dictation errors may occur despite best attempts at proofreading.   Sharman Cheek, MD 02/18/19 2003

## 2019-02-18 NOTE — ED Triage Notes (Signed)
Pt presents from the [facility] with c/o shortness of breath and weakness that started around lunch. Pt states he has not eaten lunch because he did not feel well. Pt has hx of MI. Pt also had numbness in right arm that subsided before ems arrived. pt currently alert and oriented x4. No known allergies. CBG 110.

## 2019-02-18 NOTE — ED Notes (Signed)
This RN attempted to notify legal guardian of arrival with no answer.

## 2019-04-23 ENCOUNTER — Encounter: Payer: Self-pay | Admitting: Emergency Medicine

## 2019-04-23 ENCOUNTER — Other Ambulatory Visit: Payer: Self-pay

## 2019-04-23 ENCOUNTER — Emergency Department: Payer: Medicare Other

## 2019-04-23 ENCOUNTER — Inpatient Hospital Stay
Admission: EM | Admit: 2019-04-23 | Discharge: 2019-04-24 | DRG: 177 | Disposition: A | Discharge status: Other Acute Inpatient Hospital | Payer: Medicare Other | Source: Skilled Nursing Facility | Attending: Hospitalist | Admitting: Hospitalist

## 2019-04-23 DIAGNOSIS — F17210 Nicotine dependence, cigarettes, uncomplicated: Secondary | ICD-10-CM | POA: Diagnosis present

## 2019-04-23 DIAGNOSIS — U071 COVID-19: Secondary | ICD-10-CM | POA: Diagnosis present

## 2019-04-23 DIAGNOSIS — I252 Old myocardial infarction: Secondary | ICD-10-CM

## 2019-04-23 DIAGNOSIS — I110 Hypertensive heart disease with heart failure: Secondary | ICD-10-CM | POA: Diagnosis present

## 2019-04-23 DIAGNOSIS — R079 Chest pain, unspecified: Secondary | ICD-10-CM | POA: Diagnosis present

## 2019-04-23 DIAGNOSIS — F419 Anxiety disorder, unspecified: Secondary | ICD-10-CM | POA: Diagnosis present

## 2019-04-23 DIAGNOSIS — I5032 Chronic diastolic (congestive) heart failure: Secondary | ICD-10-CM | POA: Diagnosis present

## 2019-04-23 DIAGNOSIS — J1282 Pneumonia due to coronavirus disease 2019: Secondary | ICD-10-CM | POA: Diagnosis present

## 2019-04-23 DIAGNOSIS — J9601 Acute respiratory failure with hypoxia: Secondary | ICD-10-CM | POA: Diagnosis present

## 2019-04-23 DIAGNOSIS — I2510 Atherosclerotic heart disease of native coronary artery without angina pectoris: Secondary | ICD-10-CM | POA: Diagnosis present

## 2019-04-23 DIAGNOSIS — J441 Chronic obstructive pulmonary disease with (acute) exacerbation: Secondary | ICD-10-CM | POA: Diagnosis present

## 2019-04-23 DIAGNOSIS — Z79899 Other long term (current) drug therapy: Secondary | ICD-10-CM | POA: Diagnosis not present

## 2019-04-23 DIAGNOSIS — J440 Chronic obstructive pulmonary disease with acute lower respiratory infection: Secondary | ICD-10-CM | POA: Diagnosis present

## 2019-04-23 DIAGNOSIS — R0902 Hypoxemia: Secondary | ICD-10-CM

## 2019-04-23 DIAGNOSIS — F329 Major depressive disorder, single episode, unspecified: Secondary | ICD-10-CM | POA: Diagnosis present

## 2019-04-23 DIAGNOSIS — J449 Chronic obstructive pulmonary disease, unspecified: Secondary | ICD-10-CM | POA: Diagnosis present

## 2019-04-23 DIAGNOSIS — F322 Major depressive disorder, single episode, severe without psychotic features: Secondary | ICD-10-CM | POA: Diagnosis present

## 2019-04-23 DIAGNOSIS — Z7982 Long term (current) use of aspirin: Secondary | ICD-10-CM

## 2019-04-23 DIAGNOSIS — I10 Essential (primary) hypertension: Secondary | ICD-10-CM | POA: Diagnosis present

## 2019-04-23 LAB — BLOOD GAS, VENOUS
Acid-base deficit: 1.1 mmol/L (ref 0.0–2.0)
Bicarbonate: 22.4 mmol/L (ref 20.0–28.0)
FIO2: 0.21
O2 Saturation: 93.5 %
Patient temperature: 37
pCO2, Ven: 33 mmHg — ABNORMAL LOW (ref 44.0–60.0)
pH, Ven: 7.44 — ABNORMAL HIGH (ref 7.250–7.430)
pO2, Ven: 66 mmHg — ABNORMAL HIGH (ref 32.0–45.0)

## 2019-04-23 LAB — URINALYSIS, COMPLETE (UACMP) WITH MICROSCOPIC
Bacteria, UA: NONE SEEN
Bilirubin Urine: NEGATIVE
Glucose, UA: NEGATIVE mg/dL
Hgb urine dipstick: NEGATIVE
Ketones, ur: NEGATIVE mg/dL
Leukocytes,Ua: NEGATIVE
Nitrite: NEGATIVE
Protein, ur: NEGATIVE mg/dL
Specific Gravity, Urine: 1.018 (ref 1.005–1.030)
pH: 5 (ref 5.0–8.0)

## 2019-04-23 LAB — COMPREHENSIVE METABOLIC PANEL
ALT: 12 U/L (ref 0–44)
AST: 33 U/L (ref 15–41)
Albumin: 3.6 g/dL (ref 3.5–5.0)
Alkaline Phosphatase: 71 U/L (ref 38–126)
Anion gap: 11 (ref 5–15)
BUN: 53 mg/dL — ABNORMAL HIGH (ref 8–23)
CO2: 21 mmol/L — ABNORMAL LOW (ref 22–32)
Calcium: 8.9 mg/dL (ref 8.9–10.3)
Chloride: 109 mmol/L (ref 98–111)
Creatinine, Ser: 1.6 mg/dL — ABNORMAL HIGH (ref 0.61–1.24)
GFR calc Af Amer: 47 mL/min — ABNORMAL LOW (ref >60)
GFR calc non Af Amer: 40 mL/min — ABNORMAL LOW (ref >60)
Glucose, Bld: 122 mg/dL — ABNORMAL HIGH (ref 70–99)
Potassium: 4.2 mmol/L (ref 3.5–5.1)
Sodium: 141 mmol/L (ref 135–145)
Total Bilirubin: 0.8 mg/dL (ref 0.3–1.2)
Total Protein: 7.6 g/dL (ref 6.5–8.1)

## 2019-04-23 LAB — CBC WITH DIFFERENTIAL/PLATELET
Abs Immature Granulocytes: 0.04 10*3/uL (ref 0.00–0.07)
Basophils Absolute: 0 10*3/uL (ref 0.0–0.1)
Basophils Relative: 0 %
Eosinophils Absolute: 0 10*3/uL (ref 0.0–0.5)
Eosinophils Relative: 0 %
HCT: 34.9 % — ABNORMAL LOW (ref 39.0–52.0)
Hemoglobin: 11.6 g/dL — ABNORMAL LOW (ref 13.0–17.0)
Immature Granulocytes: 1 %
Lymphocytes Relative: 31 %
Lymphs Abs: 2.5 10*3/uL (ref 0.7–4.0)
MCH: 29.5 pg (ref 26.0–34.0)
MCHC: 33.2 g/dL (ref 30.0–36.0)
MCV: 88.8 fL (ref 80.0–100.0)
Monocytes Absolute: 0.4 10*3/uL (ref 0.1–1.0)
Monocytes Relative: 5 %
Neutro Abs: 5 10*3/uL (ref 1.7–7.7)
Neutrophils Relative %: 63 %
Platelets: 141 10*3/uL — ABNORMAL LOW (ref 150–400)
RBC: 3.93 MIL/uL — ABNORMAL LOW (ref 4.22–5.81)
RDW: 15.1 % (ref 11.5–15.5)
WBC: 7.9 10*3/uL (ref 4.0–10.5)
nRBC: 0 % (ref 0.0–0.2)

## 2019-04-23 LAB — ABO/RH: ABO/RH(D): O POS

## 2019-04-23 LAB — PROCALCITONIN: Procalcitonin: <0.1 ng/mL

## 2019-04-23 LAB — POC SARS CORONAVIRUS 2 AG: SARS Coronavirus 2 Ag: POSITIVE — AB

## 2019-04-23 LAB — LACTIC ACID, PLASMA
Lactic Acid, Venous: 1.8 mmol/L (ref 0.5–1.9)
Lactic Acid, Venous: 1.7 mmol/L (ref 0.5–1.9)

## 2019-04-23 LAB — FIBRIN DERIVATIVES D-DIMER (ARMC ONLY): Fibrin derivatives D-dimer (ARMC): 862.91 ng/mL (FEU) — ABNORMAL HIGH (ref 0.00–499.00)

## 2019-04-23 MED ORDER — OCUVITE-LUTEIN PO CAPS
1.0000 | ORAL_CAPSULE | Freq: Every day | ORAL | Status: DC
  Administered 2019-04-23 – 2019-04-24 (×2): 1 via ORAL
  Filled 2019-04-23 (×2): qty 1

## 2019-04-23 MED ORDER — SENNOSIDES-DOCUSATE SODIUM 8.6-50 MG PO TABS: 1.0000 | ORAL_TABLET | Freq: Every evening | ORAL | Status: DC | PRN

## 2019-04-23 MED ORDER — TRAZODONE HCL 50 MG PO TABS
50.0000 mg | ORAL_TABLET | Freq: Every evening | ORAL | Status: DC | PRN
  Administered 2019-04-23: 22:00:00 50 mg via ORAL
  Filled 2019-04-23: qty 1

## 2019-04-23 MED ORDER — IPRATROPIUM-ALBUTEROL 0.5-2.5 (3) MG/3ML IN SOLN
3.0000 mL | Freq: Once | RESPIRATORY_TRACT | Status: AC
  Administered 2019-04-23: 3 mL via RESPIRATORY_TRACT
  Filled 2019-04-23: qty 3

## 2019-04-23 MED ORDER — OLANZAPINE 5 MG PO TABS
2.5000 mg | ORAL_TABLET | Freq: Every day | ORAL | Status: DC
  Administered 2019-04-23: 2.5 mg via ORAL
  Filled 2019-04-23: qty 1

## 2019-04-23 MED ORDER — DEXAMETHASONE SODIUM PHOSPHATE 10 MG/ML IJ SOLN
6.0000 mg | INTRAMUSCULAR | Status: DC
  Administered 2019-04-23 – 2019-04-24 (×2): 6 mg via INTRAVENOUS
  Filled 2019-04-23 (×2): qty 1

## 2019-04-23 MED ORDER — WHITE PETROLATUM EX OINT
TOPICAL_OINTMENT | Freq: Every day | CUTANEOUS | Status: DC
  Administered 2019-04-24: 1 via TOPICAL
  Filled 2019-04-23 (×2): qty 5

## 2019-04-23 MED ORDER — ONDANSETRON HCL 4 MG PO TABS: 4.0000 mg | ORAL_TABLET | Freq: Four times a day (QID) | ORAL | Status: DC | PRN

## 2019-04-23 MED ORDER — TRAMADOL HCL 50 MG PO TABS: 50.0000 mg | ORAL_TABLET | Freq: Four times a day (QID) | ORAL | Status: DC | PRN

## 2019-04-23 MED ORDER — METHYLPREDNISOLONE SODIUM SUCC 125 MG IJ SOLR
125.0000 mg | Freq: Once | INTRAMUSCULAR | Status: AC
  Administered 2019-04-23: 125 mg via INTRAVENOUS
  Filled 2019-04-23: qty 2

## 2019-04-23 MED ORDER — ENOXAPARIN SODIUM 40 MG/0.4ML ~~LOC~~ SOLN
40.0000 mg | SUBCUTANEOUS | Status: DC
  Administered 2019-04-23 – 2019-04-24 (×2): 40 mg via SUBCUTANEOUS
  Filled 2019-04-23 (×2): qty 0.4

## 2019-04-23 MED ORDER — ZINC SULFATE 220 (50 ZN) MG PO CAPS
220.0000 mg | ORAL_CAPSULE | Freq: Every day | ORAL | Status: DC
  Administered 2019-04-23 – 2019-04-24 (×2): 220 mg via ORAL
  Filled 2019-04-23 (×2): qty 1

## 2019-04-23 MED ORDER — GUAIFENESIN-DM 100-10 MG/5ML PO SYRP
10.0000 mL | ORAL_SOLUTION | ORAL | Status: DC | PRN
  Filled 2019-04-23: qty 10

## 2019-04-23 MED ORDER — IPRATROPIUM-ALBUTEROL 20-100 MCG/ACT IN AERS
1.0000 | INHALATION_SPRAY | Freq: Four times a day (QID) | RESPIRATORY_TRACT | Status: DC
  Administered 2019-04-23: 1 via RESPIRATORY_TRACT
  Filled 2019-04-23: qty 4

## 2019-04-23 MED ORDER — ASPIRIN EC 81 MG PO TBEC
81.0000 mg | DELAYED_RELEASE_TABLET | Freq: Every day | ORAL | Status: DC
  Administered 2019-04-23 – 2019-04-24 (×2): 81 mg via ORAL
  Filled 2019-04-23 (×2): qty 1

## 2019-04-23 MED ORDER — BACITRACIN-POLYMYXIN B 500-10000 UNIT/GM EX OINT: TOPICAL_OINTMENT | Freq: Two times a day (BID) | CUTANEOUS | Status: DC

## 2019-04-23 MED ORDER — IPRATROPIUM-ALBUTEROL 20-100 MCG/ACT IN AERS
1.0000 | INHALATION_SPRAY | Freq: Four times a day (QID) | RESPIRATORY_TRACT | Status: DC
  Administered 2019-04-23 – 2019-04-24 (×3): 1 via RESPIRATORY_TRACT
  Filled 2019-04-23: qty 4

## 2019-04-23 MED ORDER — LOPERAMIDE HCL 2 MG PO CAPS
2.0000 mg | ORAL_CAPSULE | ORAL | Status: DC | PRN
  Administered 2019-04-23: 2 mg via ORAL
  Filled 2019-04-23: qty 1

## 2019-04-23 MED ORDER — HYDROCOD POLST-CPM POLST ER 10-8 MG/5ML PO SUER
5.0000 mL | Freq: Two times a day (BID) | ORAL | Status: DC | PRN
  Administered 2019-04-23: 5 mL via ORAL
  Filled 2019-04-23: qty 5

## 2019-04-23 MED ORDER — SODIUM CHLORIDE 0.9 % IV SOLN
100.0000 mg | Freq: Every day | INTRAVENOUS | Status: DC
  Administered 2019-04-24: 09:00:00 100 mg via INTRAVENOUS
  Filled 2019-04-23: qty 20
  Filled 2019-04-23: qty 100
  Filled 2019-04-23: qty 20

## 2019-04-23 MED ORDER — TIOTROPIUM BROMIDE MONOHYDRATE 18 MCG IN CAPS
18.0000 ug | ORAL_CAPSULE | Freq: Every day | RESPIRATORY_TRACT | Status: DC
  Administered 2019-04-23 – 2019-04-24 (×2): 18 ug via RESPIRATORY_TRACT
  Filled 2019-04-23 (×2): qty 5

## 2019-04-23 MED ORDER — DOUBLE ANTIBIOTIC 500-10000 UNIT/GM EX OINT
TOPICAL_OINTMENT | Freq: Two times a day (BID) | CUTANEOUS | Status: DC
  Filled 2019-04-23: qty 28
  Filled 2019-04-23: qty 28.4

## 2019-04-23 MED ORDER — ALPRAZOLAM 0.25 MG PO TABS: 0.2500 mg | ORAL_TABLET | Freq: Every evening | ORAL | Status: DC | PRN

## 2019-04-23 MED ORDER — NITROGLYCERIN 0.4 MG SL SUBL: 0.4000 mg | SUBLINGUAL_TABLET | SUBLINGUAL | Status: DC | PRN

## 2019-04-23 MED ORDER — SERTRALINE HCL 50 MG PO TABS
50.0000 mg | ORAL_TABLET | Freq: Every day | ORAL | Status: DC
  Administered 2019-04-23 – 2019-04-24 (×2): 50 mg via ORAL
  Filled 2019-04-23 (×2): qty 1

## 2019-04-23 MED ORDER — MAGNESIUM HYDROXIDE 400 MG/5ML PO SUSP: 30.0000 mL | Freq: Every day | ORAL | Status: DC | PRN

## 2019-04-23 MED ORDER — SODIUM CHLORIDE 0.9 % IV SOLN
200.0000 mg | Freq: Once | INTRAVENOUS | Status: AC
  Administered 2019-04-23: 200 mg via INTRAVENOUS
  Filled 2019-04-23: qty 200

## 2019-04-23 MED ORDER — IPRATROPIUM-ALBUTEROL 0.5-2.5 (3) MG/3ML IN SOLN
3.0000 mL | Freq: Once | RESPIRATORY_TRACT | Status: AC
  Administered 2019-04-23: 11:00:00 3 mL via RESPIRATORY_TRACT
  Filled 2019-04-23: qty 3

## 2019-04-23 MED ORDER — SIMVASTATIN 10 MG PO TABS
40.0000 mg | ORAL_TABLET | Freq: Every evening | ORAL | Status: DC
  Administered 2019-04-23: 40 mg via ORAL
  Filled 2019-04-23: qty 4

## 2019-04-23 MED ORDER — ONDANSETRON HCL 4 MG/2ML IJ SOLN: 4.0000 mg | Freq: Four times a day (QID) | INTRAMUSCULAR | Status: DC | PRN

## 2019-04-23 MED ORDER — PRAZOSIN HCL 1 MG PO CAPS
5.0000 mg | ORAL_CAPSULE | Freq: Every day | ORAL | Status: DC
  Administered 2019-04-23: 5 mg via ORAL
  Filled 2019-04-23: qty 5

## 2019-04-23 MED ORDER — VITAMIN C 500 MG/5ML PO SYRP
500.0000 mg | ORAL_SOLUTION | Freq: Every day | ORAL | Status: DC
  Administered 2019-04-23 – 2019-04-24 (×2): 500 mg via ORAL
  Filled 2019-04-23 (×2): qty 5

## 2019-04-23 MED ORDER — WHITE PETROLATUM EX GEL: 1.0000 "application " | Freq: Every day | CUTANEOUS | Status: DC

## 2019-04-23 MED ORDER — ACETAMINOPHEN 325 MG PO TABS: 650.0000 mg | ORAL_TABLET | Freq: Four times a day (QID) | ORAL | Status: DC | PRN

## 2019-04-23 MED ORDER — LISINOPRIL 10 MG PO TABS
40.0000 mg | ORAL_TABLET | Freq: Every day | ORAL | Status: DC
  Administered 2019-04-23 – 2019-04-24 (×2): 40 mg via ORAL
  Filled 2019-04-23 (×2): qty 4

## 2019-04-23 NOTE — ED Provider Notes (Signed)
[facility] Emergency Department Provider Note       Time seen: ----------------------------------------- 10:08 AM on 04/23/2019 -----------------------------------------   I have reviewed the triage vital signs and the nursing notes.  HISTORY   Chief Complaint No chief complaint on file.    HPI William Wilcox is a 79 y.o. male with a history of COPD, coronary disease, hypertension, MI who presents to the ED for fever, cough and borderline hypoxia.  He was 89 to 90% on room air and was placed on 3 L by EMS.  He states he has been sick for several days, has had productive sputum and dark urine.  Past Medical History:  Diagnosis Date  . COPD (chronic obstructive pulmonary disease) ([facility])   . Coronary artery disease   . Hypertension   . MI (myocardial infarction) [facility])     Patient Active Problem List   Diagnosis Date Noted  . Chest pain 02/01/2019  . Leg pain 02/09/2017  . Lower extremity pain, bilateral 10/04/2016  . Dyslipidemia 04/14/2015  . Tobacco use disorder 04/14/2015  . 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 Major depressive disorder, single episode, severe without psychotic features ([facility]) 04/13/2015  . COPD (chronic obstructive pulmonary disease) ([facility]) 04/13/2015  . Chronic hip pain 04/13/2015  . Hypertension 04/13/2015    Past Surgical History:  Procedure Laterality Date  . BACK SURGERY    . CARDIAC SURGERY     triple bypass  . HIP SURGERY Left     Allergies Patient has no known allergies.  Social History Social History   Tobacco Use  . Smoking status: Current Every Day Smoker    Packs/day: 2.00    Years: 57.00    Pack years: 114.00    Types: Cigarettes  . Smokeless tobacco: Never Used  Substance Use Topics  . Alcohol use: No  . Drug use: No    Review of Systems Constitutional: Positive for fever Cardiovascular: Negative for chest pain. Respiratory: Positive  shortness of breath and cough Gastrointestinal: Negative for abdominal pain, vomiting and diarrhea. Musculoskeletal: Negative for back pain. Skin: Negative for rash. Neurological: Negative for headaches, focal weakness or numbness.  All systems negative/normal/unremarkable except as stated in the HPI  ____________________________________________   PHYSICAL EXAM:  VITAL SIGNS: ED Triage Vitals  Enc Vitals Group     BP      Pulse      Resp      Temp      Temp src      SpO2      Weight      Height      Head Circumference      Peak Flow      Pain Score      Pain Loc      Pain Edu?      Excl. in GC?    Constitutional: Alert and oriented.  Mild distress Eyes: Conjunctivae are normal. Normal extraocular movements. ENT      Head: Normocephalic and atraumatic.      Nose: No congestion/rhinnorhea.      Mouth/Throat: Mucous membranes are moist.      Neck: No stridor. Cardiovascular: Normal rate, regular rhythm. No murmurs, rubs, or gallops. Respiratory: Mild tachypnea with wheezing and rhonchi bilaterally Gastrointestinal: Soft and nontender. Normal bowel sounds Musculoskeletal: Nontender with normal range of motion in extremities. No lower extremity tenderness nor edema. Neurologic:  Normal speech and language. No gross focal neurologic deficits are appreciated.  Skin:  Skin is warm, dry and intact. No rash noted. Psychiatric: Mood and affect are normal. Speech and behavior are normal.  ____________________________________________  EKG: Interpreted by me.  Sinus tachycardia with rate of 102 bpm, normal PR interval, normal QRS, normal QT, nonspecific T wave changes  ____________________________________________  ED COURSE:  As part of my medical decision making, I reviewed the following data within the [place] History obtained from family if available, nursing notes, old chart and ekg, as well as notes from prior ED visits. Patient presented for fever and  respiratory symptoms, we will assess with labs and imaging as indicated at this time.   Procedures  [name] was evaluated in Emergency Department on 04/23/2019 for the symptoms described in the history of present illness. He was evaluated in the context of the global COVID-19 pandemic, which necessitated consideration that the patient might be at risk for infection with the SARS-CoV-2 virus that causes COVID-19. Institutional protocols and algorithms that pertain to the evaluation of patients at risk for COVID-19 are in a state of rapid change based on information released by regulatory bodies including the CDC and federal and state organizations. These policies and algorithms were followed during the patient's care in the ED.  ____________________________________________   LABS (pertinent positives/negatives)  Labs Reviewed  COMPREHENSIVE METABOLIC PANEL - Abnormal; Notable for the following components:      Result Value   CO2 21 (*)    Glucose, Bld 122 (*)    BUN 53 (*)    Creatinine, Ser 1.60 (*)    GFR calc non Af Amer 40 (*)    GFR calc Af Amer 47 (*)    All other components within normal limits  CBC WITH DIFFERENTIAL/PLATELET - Abnormal; Notable for the following components:   RBC 3.93 (*)    Hemoglobin 11.6 (*)    HCT 34.9 (*)    Platelets 141 (*)    All other components within normal limits  BLOOD GAS, VENOUS - Abnormal; Notable for the following components:   pH, Ven 7.44 (*)    pCO2, Ven 33 (*)    pO2, Ven 66.0 (*)    All other components within normal limits  POC SARS CORONAVIRUS 2 AG - Abnormal; Notable for the following components:   SARS Coronavirus 2 Ag POSITIVE (*)    All other components within normal limits  CULTURE, BLOOD (ROUTINE X 2)  CULTURE, BLOOD (ROUTINE X 2)  URINE CULTURE  LACTIC ACID, PLASMA  LACTIC ACID, PLASMA  URINALYSIS, COMPLETE (UACMP) WITH MICROSCOPIC  POC SARS CORONAVIRUS 2 AG -  ED   CRITICAL CARE Performed by: Laurence Aly   Total critical care time: 30 minutes  Critical care time was exclusive of separately billable procedures and treating other patients.  Critical care was necessary to treat or prevent imminent or life-threatening deterioration.  Critical care was time spent personally by me on the following activities: development of treatment plan with patient and/or surrogate as well as nursing, discussions with consultants, evaluation of patient's response to treatment, examination of patient, obtaining history from patient or surrogate, ordering and performing treatments and interventions, ordering and review of laboratory studies, ordering and review of radiographic studies, pulse oximetry and re-evaluation of patient's condition.  RADIOLOGY Images were viewed by me  Chest x-ray IMPRESSION:  Mild bilateral ill-defined airspace opacities are noted concerning  for edema or possibly multifocal pneumonia. Followup radiographs are  recommended until resolution.  ____________________________________________   DIFFERENTIAL DIAGNOSIS  COPD, pneumonia, COVID-19, CHF  FINAL ASSESSMENT AND PLAN  COVID-19, hypoxia, COPD exacerbation   Plan: The patient had presented for shortness of breath with a productive cough. Patient's labs reveal coronavirus positivity. Patient's imaging did reveal bilateral pneumonia consistent with Covid.  Patient is still requiring nasal cannula oxygen.  He received nebs and steroids.  I will discuss with the hospitalist for admission.   Ulice Dash, MD    Note: This note was generated in part or whole with voice recognition software. Voice recognition is usually quite accurate but there are transcription errors that can and very often do occur. I apologize for any typographical errors that were not detected and corrected.     Emily Filbert, MD 04/23/19 1149

## 2019-04-23 NOTE — ED Notes (Signed)
Patient resting quietly in room with no apparent acute distress and expressing no needs at this time.  Urinal provided, bed in lowest position, call light within reach.  Will continue to monitor.

## 2019-04-23 NOTE — ED Notes (Signed)
Report to Sonjia, RN 

## 2019-04-23 NOTE — ED Notes (Signed)
Report given to Rebecca RN/Kate B RN 

## 2019-04-23 NOTE — ED Notes (Signed)
Patient resting quietly in room with no apparent acute distress and expressing no needs at this time.  Toileting offered, bed in lowest position, call light within reach.  Will continue to monitor.   

## 2019-04-23 NOTE — ED Notes (Signed)
Report to include Situation, Background, Assessment, and Recommendations received from [facility]. Patient alert and oriented, states he needs to have a bowel movement and placed on bedpan.  Patient's bedding was changed and peri care performed.  Urinal made available.  Pt blanket removed since it was soiled and placed in patient belonging bag.  Warm blanket provided.  Ginger ale provided per pt request.  Bed is locked in lowest position, call light within reach, lights dimmed for pt comfort.

## 2019-04-23 NOTE — H&P (Signed)
[place] at [facility]   PATIENT NAME: William Wilcox    MR#:  [id number]  DATE OF BIRTH:  July 26, 1940  DATE OF ADMISSION:  04/23/2019  PRIMARY CARE PHYSICIAN: [facility]   REQUESTING/REFERRING PHYSICIAN: Emily Filbert, MD  CHIEF COMPLAINT:   Chief Complaint  Patient presents with  . Cough  . Weakness    HISTORY OF PRESENT ILLNESS:  William Wilcox  is a 79 y.o. male with a known history of COPD, coronary disease, hypertension, MI being admitted for COVID. Patient came to the ED for fever, cough and borderline hypoxia.  He was 89 to 90% on room air and was placed on 3 L by EMS.  He states he has been sick for several days, has had productive sputum and dark urine. While in ED, COVID + PAST MEDICAL HISTORY:   Past Medical History:  Diagnosis Date  . COPD (chronic obstructive pulmonary disease) (HCC)   . Coronary artery disease   . Hypertension   . MI (myocardial infarction) (HCC)     PAST SURGICAL HISTORY:   Past Surgical History:  Procedure Laterality Date  . BACK SURGERY    . CARDIAC SURGERY     triple bypass  . HIP SURGERY Left     SOCIAL HISTORY:   Social History   Tobacco Use  . Smoking status: Current Every Day Smoker    Packs/day: 2.00    Years: 57.00    Pack years: 114.00    Types: Cigarettes  . Smokeless tobacco: Never Used  Substance Use Topics  . Alcohol use: No    FAMILY HISTORY:  No family history on file.  DRUG ALLERGIES:  No Known Allergies  REVIEW OF SYSTEMS:   Review of Systems  Constitutional: Positive for fever. Negative for diaphoresis, malaise/fatigue and weight loss.  HENT: Negative for ear discharge, ear pain, hearing loss, nosebleeds, sore throat and tinnitus.   Eyes: Negative for blurred vision and pain.  Respiratory: Positive for cough and shortness of breath. Negative for hemoptysis and wheezing.   Cardiovascular: Negative for chest pain, palpitations, orthopnea and leg swelling.    Gastrointestinal: Negative for abdominal pain, blood in stool, constipation, diarrhea, heartburn, nausea and vomiting.  Genitourinary: Negative for dysuria, frequency and urgency.  Musculoskeletal: Negative for back pain and myalgias.  Skin: Negative for itching and rash.  Neurological: Negative for dizziness, tingling, tremors, focal weakness, seizures, weakness and headaches.  Psychiatric/Behavioral: Negative for depression. The patient is not nervous/anxious.     MEDICATIONS AT HOME:   Prior to Admission medications   Medication Sig Start Date End Date Taking? Authorizing Provider  acetaminophen (TYLENOL) 325 MG tablet Take 650 mg by mouth.    [provider]  albuterol (PROVENTIL HFA;VENTOLIN HFA) 108 (90 Base) MCG/ACT inhaler Inhale into the lungs.    [provider]  ALPRAZolam Prudy Feeler) 0.25 MG tablet Take 0.25 mg by mouth at bedtime as needed for anxiety.    [provider]  alum & mag hydroxide-simeth (MAALOX/MYLANTA) 200-200-20 MG/5ML suspension Take 30 mLs by mouth every 6 (six) hours as needed for indigestion or heartburn.    [provider]  ammonium lactate (LAC-HYDRIN) 12 % lotion Apply 1 application topically as needed for dry skin.    [provider]  aspirin EC 81 MG tablet Take 1 tablet (81 mg total) by mouth daily. 02/02/19 02/02/20  Shaune Pollack, MD  bacitracin-polymyxin b (POLYSPORIN) ointment Apply topically 2 (two) times daily.    [provider]  diphenhydrAMINE (BENADRYL) 25 MG tablet Take 25 mg by mouth every 6 (six) hours as needed.    [provider]  EPINEPHrine 0.3 mg/0.3 mL IJ SOAJ injection Inject 0.3 mg into the muscle as needed for anaphylaxis.    [provider]  guaiFENesin-dextromethorphan (ROBITUSSIN DM) 100-10 MG/5ML syrup Take 5 mLs by mouth.    [provider]  Ipratropium-Albuterol (COMBIVENT RESPIMAT) 20-100 MCG/ACT AERS respimat Inhale 1 puff into the lungs every 6 (six)  hours.    [provider]  ipratropium-albuterol (DUONEB) 0.5-2.5 (3) MG/3ML SOLN Take 3 mLs by nebulization every 4 (four) hours as needed.    [provider]  lisinopril (PRINIVIL,ZESTRIL) 40 MG tablet Take 40 mg by mouth daily.    [provider]  loperamide (IMODIUM) 2 MG capsule Take 2 mg by mouth as needed for diarrhea or loose stools.    [provider]  magnesium hydroxide (MILK OF MAGNESIA) 400 MG/5ML suspension Take 30 mLs by mouth daily as needed for mild constipation.    [provider]  multivitamin-lutein (OCUVITE-LUTEIN) CAPS capsule Take 1 capsule by mouth daily.    [provider]  nitroGLYCERIN (NITROSTAT) 0.4 MG SL tablet Place 0.4 mg under the tongue every 5 (five) minutes as needed for chest pain.    [provider]  NON FORMULARY Apply 1 application topically 2 (two) times daily. Benadryl cream 2%    [provider]  OLANZapine (ZYPREXA) 2.5 MG tablet Take 2.5 mg by mouth at bedtime.    [provider]  prazosin (MINIPRESS) 5 MG capsule Take 5 mg by mouth at bedtime.    [provider]  sertraline (ZOLOFT) 50 MG tablet Take 50 mg by mouth daily.    [provider]  simvastatin (ZOCOR) 40 MG tablet Take 40 mg by mouth every evening.     [provider]  tiotropium (SPIRIVA) 18 MCG inhalation capsule Place 18 mcg into inhaler and inhale daily.    [provider]  traMADol (ULTRAM) 50 MG tablet Take 50 mg by mouth every 12 (twelve) hours as needed.    [provider]  traZODone (DESYREL) 50 MG tablet Take 50 mg by mouth at bedtime as needed for sleep.    [provider]  triamcinolone cream (KENALOG) 0.1 % Apply 1 application topically 2 (two) times daily.     [provider]  trolamine salicylate (ASPERCREME) 10 % cream Apply 1 application topically as needed for muscle pain.    [provider]  white petrolatum ointment Apply 1  application topically daily.    [provider]      VITAL SIGNS:  Blood pressure (!) 102/51, pulse (!) 107, temperature 98.6 F (37 C), temperature source Oral, resp. rate (!) 23, weight 60.9 kg, SpO2 99 %.  PHYSICAL EXAMINATION:  Physical Exam  GENERAL:  79 y.o.-year-old patient lying in the bed with no acute distress.  EYES: Pupils equal, round, reactive to light and accommodation. No scleral icterus. Extraocular muscles intact.  HEENT: Head atraumatic, normocephalic. Oropharynx and nasopharynx clear.  NECK:  Supple, no jugular venous distention. No thyroid enlargement, no tenderness.  LUNGS: Normal breath sounds bilaterally, no wheezing, rales,rhonchi or crepitation. No use of accessory muscles of respiration.  CARDIOVASCULAR: S1, S2 normal. No murmurs, rubs, or gallops.  ABDOMEN: Soft, nontender, nondistended. Bowel sounds present. No organomegaly or mass.  EXTREMITIES: No pedal edema, cyanosis, or clubbing.  NEUROLOGIC: Cranial nerves II through XII are intact. Muscle strength 5/5 in  all extremities. Sensation intact. Gait not checked.  PSYCHIATRIC: The patient is alert and oriented x 3.  SKIN: No obvious rash, lesion, or ulcer.   LABORATORY PANEL:   CBC Recent Labs  Lab 04/23/19 1030  WBC 7.9  HGB 11.6*  HCT 34.9*  PLT 141*   ------------------------------------------------------------------------------------------------------------------  Chemistries  Recent Labs  Lab 04/23/19 1030  NA 141  K 4.2  CL 109  CO2 21*  GLUCOSE 122*  BUN 53*  CREATININE 1.60*  CALCIUM 8.9  AST 33  ALT 12  ALKPHOS 71  BILITOT 0.8   ------------------------------------------------------------------------------------------------------------------  Cardiac Enzymes No results for input(s): TROPONINI in the last 168 hours. ------------------------------------------------------------------------------------------------------------------  RADIOLOGY:  DG Chest Port 1  View  Result Date: 04/23/2019 CLINICAL DATA:  Fever. EXAM: PORTABLE CHEST 1 VIEW COMPARISON:  February 01, 2019. FINDINGS: Stable cardiomediastinal silhouette. Status post coronary bypass graft. No pneumothorax or pleural effusion is noted. Mild bilateral ill-defined airspace opacities are noted concerning for edema or possibly multifocal pneumonia. Bony thorax is unremarkable. IMPRESSION: Mild bilateral ill-defined airspace opacities are noted concerning for edema or possibly multifocal pneumonia. Followup radiographs are recommended until resolution. Electronically Signed   By: Marijo Conception M.D.   On: 04/23/2019 10:25   IMPRESSION AND PLAN:  43 Y M admitted for COVID pneumonia  1.COVID Pneumonia - IV remdesevir day 1/5 - IV decadron mg IV daily - daily zinc and vit c - check inflammatory markers - on 5 liters O2, may need actemera  2. Chronic diastolic CHF - well compensated at this time - recent echo in 10/20 showed normal EF  3. Acute hypoxic resp failure - due to 1. Wean O2 as able  4. Depression and anxiety - continue home dose of zyprexa, zoloft, trazodone and xanax     All the records are reviewed and case discussed with ED provider. Management plans discussed with the patient, nursing and they are in agreement.  CODE STATUS: FULL CODE  TOTAL TIME TAKING CARE OF THIS PATIENT: 45 minutes.    Max Sane M.D on 04/23/2019 at 12:51 PM  Between 7am to 6pm - Pager - [phone]  After 6pm go to [email or web address] - password [email or web address]  [facility]   CC: Primary care physician; [place]   Note: This dictation was prepared with Dragon dictation along with smaller phrase technology. Any transcriptional errors that result from this process are unintentional.

## 2019-04-23 NOTE — Progress Notes (Signed)
Remdesivir - Pharmacy Brief Note   O:  ALT: 12 CXR: Mild bilateral ill-defined airspace opacities are noted concerning for edema or possibly multifocal pneumonia SpO2: 91% on 3LNC   A/P:  Remdesivir 200 mg IVPB once followed by 100 mg IVPB daily x 4 days.   Laureen Ochs, PharmD 04/23/2019 2:02 PM

## 2019-04-23 NOTE — ED Triage Notes (Signed)
Pt to ED via [facility] from [facility] [address] [facility]. Pt was sent out for fever, cough, and dark urine. Per EMS when they arrives pt SpO2 was 88% on RA. Pt was placed on 3 liter via [place] by EMS with improvement in sat to 93%. Pt is currently A & O x 2. Febrile per EMS at 100.3

## 2019-04-23 NOTE — ED Notes (Addendum)
Patient is alert and orientedx2 and active on the stretcher, requested something to drink. Dr. Mayford Knife aware.

## 2019-04-23 NOTE — ED Notes (Signed)
Fall risk bracelet placed on right wrist, yellow socks put on feet

## 2019-04-24 ENCOUNTER — Inpatient Hospital Stay (HOSPITAL_COMMUNITY)
Admission: AD | Admit: 2019-04-24 | Discharge: 2019-05-02 | DRG: 177 | Disposition: A | Discharge status: Hospice | Payer: Medicare Other | Source: Other Acute Inpatient Hospital | Attending: Internal Medicine | Admitting: Internal Medicine

## 2019-04-24 ENCOUNTER — Encounter (HOSPITAL_COMMUNITY): Payer: Self-pay | Admitting: Internal Medicine

## 2019-04-24 DIAGNOSIS — I2510 Atherosclerotic heart disease of native coronary artery without angina pectoris: Secondary | ICD-10-CM | POA: Diagnosis not present

## 2019-04-24 DIAGNOSIS — I252 Old myocardial infarction: Secondary | ICD-10-CM

## 2019-04-24 DIAGNOSIS — F17200 Nicotine dependence, unspecified, uncomplicated: Secondary | ICD-10-CM | POA: Diagnosis present

## 2019-04-24 DIAGNOSIS — Z7982 Long term (current) use of aspirin: Secondary | ICD-10-CM | POA: Diagnosis not present

## 2019-04-24 DIAGNOSIS — J9601 Acute respiratory failure with hypoxia: Secondary | ICD-10-CM | POA: Diagnosis present

## 2019-04-24 DIAGNOSIS — Z66 Do not resuscitate: Secondary | ICD-10-CM | POA: Diagnosis not present

## 2019-04-24 DIAGNOSIS — Z79899 Other long term (current) drug therapy: Secondary | ICD-10-CM

## 2019-04-24 DIAGNOSIS — U071 COVID-19: Secondary | ICD-10-CM | POA: Diagnosis present

## 2019-04-24 DIAGNOSIS — J1282 Pneumonia due to coronavirus disease 2019: Secondary | ICD-10-CM | POA: Diagnosis present

## 2019-04-24 DIAGNOSIS — Z79891 Long term (current) use of opiate analgesic: Secondary | ICD-10-CM | POA: Diagnosis not present

## 2019-04-24 DIAGNOSIS — I10 Essential (primary) hypertension: Secondary | ICD-10-CM | POA: Diagnosis present

## 2019-04-24 DIAGNOSIS — E785 Hyperlipidemia, unspecified: Secondary | ICD-10-CM | POA: Diagnosis present

## 2019-04-24 DIAGNOSIS — J449 Chronic obstructive pulmonary disease, unspecified: Secondary | ICD-10-CM | POA: Diagnosis present

## 2019-04-24 DIAGNOSIS — F322 Major depressive disorder, single episode, severe without psychotic features: Secondary | ICD-10-CM | POA: Diagnosis not present

## 2019-04-24 DIAGNOSIS — F17210 Nicotine dependence, cigarettes, uncomplicated: Secondary | ICD-10-CM | POA: Diagnosis not present

## 2019-04-24 DIAGNOSIS — J410 Simple chronic bronchitis: Secondary | ICD-10-CM | POA: Diagnosis not present

## 2019-04-24 DIAGNOSIS — J440 Chronic obstructive pulmonary disease with acute lower respiratory infection: Secondary | ICD-10-CM | POA: Diagnosis present

## 2019-04-24 DIAGNOSIS — F332 Major depressive disorder, recurrent severe without psychotic features: Secondary | ICD-10-CM | POA: Diagnosis present

## 2019-04-24 LAB — HIV ANTIBODY (ROUTINE TESTING W REFLEX): HIV Screen 4th Generation wRfx: NONREACTIVE

## 2019-04-24 MED ORDER — HYDROCOD POLST-CPM POLST ER 10-8 MG/5ML PO SUER
5.0000 mL | Freq: Two times a day (BID) | ORAL | Status: DC | PRN
  Administered 2019-04-27: 5 mL via ORAL
  Filled 2019-04-24: qty 5

## 2019-04-24 MED ORDER — DEXAMETHASONE SODIUM PHOSPHATE 10 MG/ML IJ SOLN: 6.0000 mg | INTRAMUSCULAR | 0 refills | Status: DC

## 2019-04-24 MED ORDER — LISINOPRIL 20 MG PO TABS
20.0000 mg | ORAL_TABLET | Freq: Every day | ORAL | Status: DC
  Administered 2019-04-25: 20 mg via ORAL
  Filled 2019-04-24: qty 1

## 2019-04-24 MED ORDER — ACETAMINOPHEN 325 MG PO TABS: 650.0000 mg | ORAL_TABLET | Freq: Four times a day (QID) | ORAL | Status: DC | PRN

## 2019-04-24 MED ORDER — DOCUSATE SODIUM 100 MG PO CAPS
100.0000 mg | ORAL_CAPSULE | Freq: Every day | ORAL | Status: DC
  Administered 2019-04-25 – 2019-04-29 (×5): 100 mg via ORAL
  Filled 2019-04-24 (×5): qty 1

## 2019-04-24 MED ORDER — TIOTROPIUM BROMIDE MONOHYDRATE 18 MCG IN CAPS: ORAL_CAPSULE | RESPIRATORY_TRACT | Status: AC

## 2019-04-24 MED ORDER — OLANZAPINE 2.5 MG PO TABS
2.5000 mg | ORAL_TABLET | Freq: Every day | ORAL | Status: DC
  Administered 2019-04-25 – 2019-04-30 (×6): 2.5 mg via ORAL
  Filled 2019-04-24 (×9): qty 1

## 2019-04-24 MED ORDER — TIOTROPIUM BROMIDE MONOHYDRATE 18 MCG IN CAPS: 18.0000 ug | ORAL_CAPSULE | Freq: Every day | RESPIRATORY_TRACT | Status: DC

## 2019-04-24 MED ORDER — ZINC SULFATE 220 (50 ZN) MG PO CAPS: 220.0000 mg | ORAL_CAPSULE | Freq: Every day | ORAL | Status: DC

## 2019-04-24 MED ORDER — GUAIFENESIN-DM 100-10 MG/5ML PO SYRP: 15.0000 mL | ORAL_SOLUTION | Freq: Four times a day (QID) | ORAL | Status: AC | PRN

## 2019-04-24 MED ORDER — SIMVASTATIN 20 MG PO TABS
40.0000 mg | ORAL_TABLET | Freq: Every day | ORAL | Status: DC
  Administered 2019-04-24 – 2019-04-30 (×7): 40 mg via ORAL
  Filled 2019-04-24 (×7): qty 2

## 2019-04-24 MED ORDER — ONDANSETRON HCL 4 MG/2ML IJ SOLN
4.0000 mg | Freq: Four times a day (QID) | INTRAMUSCULAR | Status: DC | PRN
  Administered 2019-05-01: 4 mg via INTRAVENOUS
  Filled 2019-04-24: qty 2

## 2019-04-24 MED ORDER — HYDROCOD POLST-CPM POLST ER 10-8 MG/5ML PO SUER: 5.0000 mL | Freq: Two times a day (BID) | ORAL | Status: AC | PRN

## 2019-04-24 MED ORDER — APIXABAN 2.5 MG PO TABS: ORAL_TABLET | ORAL | Status: DC

## 2019-04-24 MED ORDER — ONDANSETRON HCL 4 MG PO TABS
4.0000 mg | ORAL_TABLET | Freq: Four times a day (QID) | ORAL | Status: DC | PRN
  Administered 2019-04-26: 4 mg via ORAL
  Filled 2019-04-24: qty 1

## 2019-04-24 MED ORDER — ASCORBIC ACID 500 MG PO TABS
500.0000 mg | ORAL_TABLET | Freq: Every day | ORAL | Status: DC
  Administered 2019-04-25 – 2019-04-30 (×6): 500 mg via ORAL
  Filled 2019-04-24 (×6): qty 1

## 2019-04-24 MED ORDER — ALPRAZOLAM 0.5 MG PO TABS
0.2500 mg | ORAL_TABLET | Freq: Every evening | ORAL | Status: DC | PRN
  Administered 2019-04-26 – 2019-04-30 (×3): 0.25 mg via ORAL
  Filled 2019-04-24 (×3): qty 1

## 2019-04-24 MED ORDER — GUAIFENESIN-DM 100-10 MG/5ML PO SYRP: 10.0000 mL | ORAL_SOLUTION | ORAL | Status: DC | PRN

## 2019-04-24 MED ORDER — DEXAMETHASONE 6 MG PO TABS
6.0000 mg | ORAL_TABLET | ORAL | Status: DC
  Administered 2019-04-25 – 2019-04-26 (×2): 6 mg via ORAL
  Filled 2019-04-24 (×2): qty 1

## 2019-04-24 MED ORDER — APIXABAN 2.5 MG PO TABS: 2.5000 mg | ORAL_TABLET | Freq: Two times a day (BID) | ORAL | Status: DC

## 2019-04-24 MED ORDER — FUROSEMIDE 10 MG/ML IJ SOLN
20.0000 mg | Freq: Once | INTRAMUSCULAR | Status: AC
  Administered 2019-04-25: 20 mg via INTRAVENOUS
  Filled 2019-04-24: qty 2

## 2019-04-24 MED ORDER — IPRATROPIUM-ALBUTEROL 0.5-2.5 (3) MG/3ML IN SOLN: RESPIRATORY_TRACT | Status: AC

## 2019-04-24 MED ORDER — MELATONIN 3 MG PO TABS
6.0000 mg | ORAL_TABLET | Freq: Every day | ORAL | Status: DC
  Administered 2019-04-25 – 2019-04-30 (×6): 6 mg via ORAL
  Filled 2019-04-24 (×9): qty 2

## 2019-04-24 MED ORDER — VITAMIN C 500 MG/5ML PO SYRP: 500.0000 mg | ORAL_SOLUTION | Freq: Every day | ORAL | Status: DC

## 2019-04-24 MED ORDER — NITROGLYCERIN 0.4 MG SL SUBL: 0.4000 mg | SUBLINGUAL_TABLET | SUBLINGUAL | Status: DC | PRN

## 2019-04-24 MED ORDER — ASPIRIN EC 81 MG PO TBEC
81.0000 mg | DELAYED_RELEASE_TABLET | Freq: Every day | ORAL | Status: DC
  Administered 2019-04-25 – 2019-04-30 (×6): 81 mg via ORAL
  Filled 2019-04-24 (×6): qty 1

## 2019-04-24 MED ORDER — LOPERAMIDE HCL 2 MG PO CAPS: 2.0000 mg | ORAL_CAPSULE | ORAL | Status: DC | PRN

## 2019-04-24 MED ORDER — UMECLIDINIUM BROMIDE 62.5 MCG/INH IN AEPB
1.0000 | INHALATION_SPRAY | Freq: Every day | RESPIRATORY_TRACT | Status: DC
  Administered 2019-04-25 – 2019-04-30 (×6): 1 via RESPIRATORY_TRACT
  Filled 2019-04-24: qty 7

## 2019-04-24 MED ORDER — TRAMADOL HCL 50 MG PO TABS
50.0000 mg | ORAL_TABLET | Freq: Two times a day (BID) | ORAL | Status: DC | PRN
  Administered 2019-04-27 – 2019-05-01 (×3): 50 mg via ORAL
  Filled 2019-04-24 (×3): qty 1

## 2019-04-24 MED ORDER — PRAZOSIN HCL 5 MG PO CAPS
5.0000 mg | ORAL_CAPSULE | Freq: Every day | ORAL | Status: DC
  Administered 2019-04-24 – 2019-04-30 (×7): 5 mg via ORAL
  Filled 2019-04-24 (×9): qty 1

## 2019-04-24 MED ORDER — TRAZODONE HCL 50 MG PO TABS
50.0000 mg | ORAL_TABLET | Freq: Every day | ORAL | Status: DC
  Administered 2019-04-24 – 2019-05-01 (×7): 50 mg via ORAL
  Filled 2019-04-24 (×7): qty 1

## 2019-04-24 MED ORDER — SODIUM CHLORIDE 0.9 % IV SOLN
100.0000 mg | Freq: Every day | INTRAVENOUS | Status: AC
  Administered 2019-04-25 – 2019-04-27 (×3): 100 mg via INTRAVENOUS
  Filled 2019-04-24 (×3): qty 20

## 2019-04-24 MED ORDER — ENOXAPARIN SODIUM 40 MG/0.4ML ~~LOC~~ SOLN
40.0000 mg | SUBCUTANEOUS | Status: DC
  Administered 2019-04-25 – 2019-05-01 (×7): 40 mg via SUBCUTANEOUS
  Filled 2019-04-24 (×7): qty 0.4

## 2019-04-24 MED ORDER — SERTRALINE HCL 50 MG PO TABS
100.0000 mg | ORAL_TABLET | Freq: Every day | ORAL | Status: DC
  Administered 2019-04-25 – 2019-04-30 (×6): 100 mg via ORAL
  Filled 2019-04-24 (×6): qty 2

## 2019-04-24 MED ORDER — ENSURE ENLIVE PO LIQD: 237.0000 mL | Freq: Three times a day (TID) | ORAL | Status: DC

## 2019-04-24 MED ORDER — ZINC SULFATE 220 (50 ZN) MG PO CAPS
220.0000 mg | ORAL_CAPSULE | Freq: Every day | ORAL | Status: DC
  Administered 2019-04-25 – 2019-04-30 (×6): 220 mg via ORAL
  Filled 2019-04-24 (×6): qty 1

## 2019-04-24 MED ORDER — IPRATROPIUM-ALBUTEROL 20-100 MCG/ACT IN AERS
1.0000 | INHALATION_SPRAY | Freq: Four times a day (QID) | RESPIRATORY_TRACT | Status: DC
  Administered 2019-04-24 – 2019-05-01 (×24): 1 via RESPIRATORY_TRACT
  Filled 2019-04-24: qty 4

## 2019-04-24 NOTE — Evaluation (Signed)
Physical Therapy Evaluation Patient Details Name: William Wilcox MRN: [id number] DOB: [birth year] Today's Date: 04/24/2019   History of Present Illness  79 yo male admitted for Covid. PMH includes COPD, coronary disease, HTN, MI  Clinical Impression  Pt is a 79 yo male admitted for covid. Pt received in supine in ED for PT evaluation. Pt alert and oriented to self and place. Disoriented to day, month and year. Pt reports living in assisted living and that he ambulates without AD, has not had any recent falls, independent with ADLs and medication management. Pt denies any pain. Pt very weak and fatigued. Pt presents with strength grossly 2+/5. Pt presents with decreased activity tolerance stating increased fatigued after a couple supine LE therex and sitting EOB for short period. Pt required min A to get EOB and return to supine. Pt able to independently reposition in bed with increased time and effort. Pt required min A to maintain sitting balance secondary to posterior lean. Once in sitting pt reporting pain in left hip because of an old fracture and the surgeon putting the wrong hip in. Pt on 5 L O2 throughout session. Pt is not normally on supplemental O2. O2 sats closely monitored throughout with O2 sats dropping to 85% sitting EOB. Pt returned to supine and with rest and cuing for breathing O2 sats improved to mid 90s after ~1-2 min. Pt O2 sats dropped to 88-89% during supine LE therex. Pt requesting to return to supine after sitting EOB short period due to increased SOB and fatigue with further OOB mobility deferred this session. Pt presents with decreased strength, ROM, balance and activity tolerance limiting functional mobility. Pt would benefit from acute PT to improve noted deficits. Recommendation for STR following hospital discharge to further maximize mobility and allow for return to PLOF.     Follow Up Recommendations SNF    Equipment Recommendations  Other (comment)(TBD next venue of  care)    Recommendations for Other Services       Precautions / Restrictions Precautions Precautions: Fall Restrictions Weight Bearing Restrictions: No      Mobility  Bed Mobility Overal bed mobility: Needs Assistance Bed Mobility: Supine to Sit;Sit to Supine     Supine to sit: Min assist;HOB elevated Sit to supine: Min assist;HOB elevated   General bed mobility comments: min A to get EOB and return to supine, assist required for trunk elevation and descent, pt able to reposition in bed without physical assist  Transfers                 General transfer comment: deferred due to pts dec tolerance to activity/desatting sitting EOB on 5L, increased fatigue  Ambulation/Gait             General Gait Details: deferred  Stairs            Wheelchair Mobility    Modified Rankin (Stroke Patients Only)       Balance Overall balance assessment: Needs assistance Sitting-balance support: Bilateral upper extremity supported Sitting balance-Leahy Scale: Poor Sitting balance - Comments: pt reliant on min A to maintain sitting balance EOB Postural control: Posterior lean                                   Pertinent Vitals/Pain Pain Assessment: No/denies pain    Home Living Family/patient expects to be discharged to:: Assisted living  Home Equipment: None      Prior Function Level of Independence: Independent         Comments: reports not needing any assistance normally, no falls last year, walks without AD, self manages medications     Hand Dominance        Extremity/Trunk Assessment   Upper Extremity Assessment Upper Extremity Assessment: Defer to OT evaluation;Generalized weakness    Lower Extremity Assessment Lower Extremity Assessment: Generalized weakness(LE strength grossly 2+/5, limited activity tolerance limiting ability to complete all MMTs)    Cervical / Trunk Assessment Cervical / Trunk  Assessment: Kyphotic  Communication      Cognition Arousal/Alertness: Awake/alert Behavior During Therapy: WFL for tasks assessed/performed Overall Cognitive Status: Difficult to assess                                 General Comments: alert and oriented to self, place, disoriented to time      General Comments      Exercises Total Joint Exercises Ankle Circles/Pumps: AROM;Both;10 reps Short Arc Quad: AROM;Both;10 reps   Assessment/Plan    PT Assessment Patient needs continued PT services  PT Problem List Decreased strength;Decreased mobility;Decreased safety awareness;Decreased range of motion;Decreased activity tolerance;Decreased balance;Cardiopulmonary status limiting activity;Decreased knowledge of use of DME       PT Treatment Interventions DME instruction;Therapeutic exercise;Gait training;Balance training;Stair training;Neuromuscular re-education;Functional mobility training;Therapeutic activities;Patient/family education;Cognitive remediation    PT Goals (Current goals can be found in the Care Plan section)  Acute Rehab PT Goals Patient Stated Goal: none stated    Frequency Min 2X/week   Barriers to discharge        Co-evaluation               AM-PAC PT "6 Clicks" Mobility  Outcome Measure Help needed turning from your back to your side while in a flat bed without using bedrails?: A Little Help needed moving from lying on your back to sitting on the side of a flat bed without using bedrails?: A Little Help needed moving to and from a bed to a chair (including a wheelchair)?: A Lot Help needed standing up from a chair using your arms (e.g., wheelchair or bedside chair)?: A Lot Help needed to walk in hospital room?: A Lot Help needed climbing 3-5 steps with a railing? : A Lot 6 Click Score: 14    End of Session Equipment Utilized During Treatment: Oxygen Activity Tolerance: Patient limited by fatigue Patient left: in bed;with call  bell/phone within reach Nurse Communication: Mobility status PT Visit Diagnosis: Muscle weakness (generalized) (M62.81);Difficulty in walking, not elsewhere classified (R26.2)    Time: 3825-0539 PT Time Calculation (min) (ACUTE ONLY): 21 min   Charges:   PT Evaluation $PT Eval Moderate Complexity: 1 Mod          Francisco Ostrovsky PT, DPT 1:03 PM,04/24/19 [phone]   Timithy Arons Drucilla Chalet 04/24/2019, 12:55 PM

## 2019-04-24 NOTE — Evaluation (Signed)
Occupational Therapy Evaluation Patient Details Name: William Wilcox MRN: [id number] DOB: 04-26-1940 Today's Date: 04/24/2019    History of Present Illness 79 yo male admitted for Covid. PMH includes COPD, coronary disease, HTN, MI   Clinical Impression   Pt was seen for OT evaluation this date. Prior to hospital admission, pt was Indep/occasional MOD I With fxl mobility with 4WW. Pt lives in ALF, but reports I'ly performing med mgt. Currently pt demonstrates impairments as described below (See OT problem list) which functionally limit his ability to perform ADL/self-care tasks. Pt currently requires MIN A with bed level UB ADLs including self feeding and feels too fatigued to sit EOB With OT on assessment.  Pt would benefit from skilled OT to address noted impairments and functional limitations (see below for any additional details) in order to maximize safety and independence while minimizing falls risk and caregiver burden.  Upon hospital discharge, recommend pt discharge to SNF to improve pt tolerance for ADL mobility and self care ADL performance.    Follow Up Recommendations  SNF    Equipment Recommendations  Other (comment)(defer to next level of care.)    Recommendations for Other Services       Precautions / Restrictions Precautions Precautions: Fall Restrictions Weight Bearing Restrictions: No Other Position/Activity Restrictions: pt on HFNC (green)-15L      Mobility Bed Mobility   General bed mobility comments: unable to assess on OT eval-pt with c/o back pain and states no energy-already sat up with PT  Transfers                 General transfer comment: deferred    Balance                                ADL either performed or assessed with clinical judgement   ADL Overall ADL's : Needs assistance/impaired Eating/Feeding: Minimal assistance;Bed level Eating/Feeding Details (indicate cue type and reason): bed level with head of stretcher  elevated to ~50-60 degrees. Pt's hands slightly shaking-difficulty opening soda, opening straw, pouring into cup and applying lid d/t shaking which pt says is somewhat normal for him at baseline. Grooming: Set up;Bed level Grooming Details (indicate cue type and reason): HOB elevated         Upper Body Dressing : Minimal assistance;Bed level   Lower Body Dressing: Maximal assistance;Bed level Lower Body Dressing Details (indicate cue type and reason): pt declines to attempt to sit up on OT assessment.   Toilet Transfer Details (indicate cue type and reason): pt declines to attempt t/f on OT assessment.                 Vision Baseline Vision/History: Wears glasses Wears Glasses: At all times Patient Visual Report: No change from baseline       Perception     Praxis      Pertinent Vitals/Pain Pain Assessment: Faces Faces Pain Scale: Hurts even more Pain Location: lower back Pain Descriptors / Indicators: Aching Pain Intervention(s): Monitored during session;Repositioned     Hand Dominance     Extremity/Trunk Assessment Upper Extremity Assessment Upper Extremity Assessment: Generalized weakness   Lower Extremity Assessment Lower Extremity Assessment: Defer to PT evaluation;Generalized weakness   Cervical / Trunk Assessment Cervical / Trunk Assessment: Kyphotic   Communication Communication Communication: No difficulties   Cognition Arousal/Alertness: Awake/alert Behavior During Therapy: WFL for tasks assessed/performed Overall Cognitive Status: Difficult to assess  General Comments: alert and oriented to self, place, disoriented to time (states correct month, but not sure the year, day of week, or date).   General Comments       Exercises  Other Exercises Other Exercises: OT facilitates education re: role of OT, fall prevention, and d/c recommendations. Pt verbalized understanding, requires reinforcement.    Shoulder Instructions      Home Living Family/patient expects to be discharged to:: Assisted living                             Home Equipment: Walker - 4 wheels          Prior Functioning/Environment Level of Independence: Independent        Comments: reports not needing any assistance normally, no falls last year, uses 4WW occasionally, typically no AD use, self manages medications        OT Problem List: Decreased strength;Decreased activity tolerance;Impaired balance (sitting and/or standing);Cardiopulmonary status limiting activity      OT Treatment/Interventions:      OT Goals(Current goals can be found in the care plan section) Acute Rehab OT Goals Patient Stated Goal: to get stronger and get to where I feel like walking around. OT Goal Formulation: With patient Time For Goal Achievement: 05/08/19 Potential to Achieve Goals: Good  OT Frequency: Min 1X/week   Barriers to D/C:            Co-evaluation              AM-PAC OT "6 Clicks" Daily Activity     Outcome Measure Help from another person eating meals?: A Little Help from another person taking care of personal grooming?: None Help from another person toileting, which includes using toliet, bedpan, or urinal?: A Lot Help from another person bathing (including washing, rinsing, drying)?: A Lot Help from another person to put on and taking off regular upper body clothing?: A Little Help from another person to put on and taking off regular lower body clothing?: A Lot 6 Click Score: 16   End of Session Nurse Communication: Mobility status  Activity Tolerance: Patient limited by fatigue;Patient limited by pain Patient left: in bed;with call bell/phone within reach  OT Visit Diagnosis: Muscle weakness (generalized) (M62.81);Other abnormalities of gait and mobility (R26.89)                Time: 8416-6063 OT Time Calculation (min): 38 min Charges:  OT General Charges $OT Visit: 1  Visit OT Evaluation $OT Eval Moderate Complexity: 1 Mod OT Treatments $Self Care/Home Management : 8-22 mins $Therapeutic Activity: 8-22 mins  Gerrianne Scale, MS, OTR/L ascom [phone] 04/24/19, 4:33 PM

## 2019-04-24 NOTE — Discharge Summary (Addendum)
DISCHARGE SUMMARY / PROGRESS NOTE    William Wilcox  WNU:[id number] DOB: Sep 09, 1940 DOA: 04/23/2019 PCP: [facility]   Admitted From: home Disposition:  [place] [facility] stepdown CODE STATUS: Full code   Assessment & Plan:   Active Problems:   COVID-19   William Wilcox  is a 79 y.o. Caucasian male with a known history of COPD, coronary disease, hypertension, MIwho came to the ED for fever, cough and borderline hypoxia. He was 89 to 90% on room air and was placed on 3 L by EMS. He stated he has been sick for several days, has had productive sputum and dark urine. While in ED, COVID +   # Acute hypoxic respiratory failure 2/2 COVID Pneumonia --Initial O2 requirement was 3L, and then rapidly increased, and per RT in the ED, needed >15L High[place], so had to be put on non-rebreather (avoided opti-flow to limit aerosol spread).   --Procal neg, CRP not yet done.  CXR showed "Mild bilateral ill-defined airspace opacities."   PLAN: - continue IV remdesevir (started on 04/23/19) - continue IV decadron mg IV daily - daily zinc and vit c --scheduled Combivent  # Chronic diastolic CHF, not in exacerbation.  - recent echo in Oct 2020 showed normal EF  # COPD, stable --continue inhaler as Combivent scheduled  # Hx of CAD and MI --continue home ASA, statin  # HTN --continue home Lisinopril   # Depression and anxiety  --continue home dose of zyprexa, zoloft, trazodone and xanax   DVT prophylaxis: Lovenox SQ Code Status: Full code  Disposition Plan: [place] stepdown   Subjective and Interval History:  Pt reported improvement of dyspnea on suppl O2.  No fever, chest pain, abdominal pain, N/V/D, dysuria, increased swelling.   Objective: Vitals:   04/24/19 0900 04/24/19 0930 04/24/19 1015 04/24/19 1402  BP: (!) 142/47     Pulse:  76 77   Resp: 18 (!) 22 20   Temp:      TempSrc:      SpO2:  90% 93% (!) 88%  Weight:      Height:        Intake/Output Summary  (Last 24 hours) at 04/24/2019 1659 Last data filed at 04/24/2019 1037 Gross per 24 hour  Intake --  Output 400 ml  Net -400 ml   Filed Weights   04/23/19 1007  Weight: 60.9 kg    Examination:   Constitutional: NAD, AAOx3 HEENT: conjunctivae and lids normal, EOMI CV: RRR no M,R,G. Distal pulses +2.  No cyanosis.   RESP: No obvious crackles or wheezes, on non-breather  GI: +BS, NTND Extremities: No effusions, edema, or tenderness in BLE SKIN: warm, dry and intact Neuro: II - XII grossly intact.  Sensation intact Psych: Normal mood and affect.  Appropriate judgement and reason   Data Reviewed: I have personally reviewed following labs and imaging studies  CBC: Recent Labs  Lab 04/23/19 1030  WBC 7.9  NEUTROABS 5.0  HGB 11.6*  HCT 34.9*  MCV 88.8  PLT 141*   Basic Metabolic Panel: Recent Labs  Lab 04/23/19 1030  NA 141  K 4.2  CL 109  CO2 21*  GLUCOSE 122*  BUN 53*  CREATININE 1.60*  CALCIUM 8.9   GFR: Estimated Creatinine Clearance: 32.2 mL/min (A) (by C-G formula based on SCr of 1.6 mg/dL (H)). Liver Function Tests: Recent Labs  Lab 04/23/19 1030  AST 33  ALT 12  ALKPHOS 71  BILITOT 0.8  PROT 7.6  ALBUMIN  3.6   No results for input(s): LIPASE, AMYLASE in the last 168 hours. No results for input(s): AMMONIA in the last 168 hours. Coagulation Profile: No results for input(s): INR, PROTIME in the last 168 hours. Cardiac Enzymes: No results for input(s): CKTOTAL, CKMB, CKMBINDEX, TROPONINI in the last 168 hours. BNP (last 3 results) No results for input(s): PROBNP in the last 8760 hours. HbA1C: No results for input(s): HGBA1C in the last 72 hours. CBG: No results for input(s): GLUCAP in the last 168 hours. Lipid Profile: No results for input(s): CHOL, HDL, LDLCALC, TRIG, CHOLHDL, LDLDIRECT in the last 72 hours. Thyroid Function Tests: No results for input(s): TSH, T4TOTAL, FREET4, T3FREE, THYROIDAB in the last 72 hours. Anemia Panel: No results  for input(s): VITAMINB12, FOLATE, FERRITIN, TIBC, IRON, RETICCTPCT in the last 72 hours. Sepsis Labs: Recent Labs  Lab 04/23/19 1030 04/23/19 1708  PROCALCITON  --  <0.10  LATICACIDVEN 1.8 1.7    Recent Results (from the past 240 hour(s))  Blood Culture (routine x 2)     Status: None (Preliminary result)   Collection Time: 04/23/19 10:31 AM   Specimen: BLOOD  Result Value Ref Range Status   Specimen Description BLOOD LEFT ANTECUBITAL  Final   Special Requests   Final    BOTTLES DRAWN AEROBIC AND ANAEROBIC Blood Culture results may not be optimal due to an excessive volume of blood received in culture bottles   Culture   Final    NO GROWTH < 24 HOURS Performed at [facility], [address], [place], [place] [address]    Report Status PENDING  Incomplete  Blood Culture (routine x 2)     Status: None (Preliminary result)   Collection Time: 04/23/19 10:31 AM   Specimen: BLOOD  Result Value Ref Range Status   Specimen Description BLOOD BLOOD RIGHT FOREARM  Final   Special Requests   Final    BOTTLES DRAWN AEROBIC AND ANAEROBIC Blood Culture results may not be optimal due to an excessive volume of blood received in culture bottles   Culture   Final    NO GROWTH < 24 HOURS Performed at [facility], [address], [place], [place] [address]    Report Status PENDING  Incomplete      Radiology Studies: DG Chest Port 1 View  Result Date: 04/23/2019 CLINICAL DATA:  Fever. EXAM: PORTABLE CHEST 1 VIEW COMPARISON:  February 01, 2019. FINDINGS: Stable cardiomediastinal silhouette. Status post coronary bypass graft. No pneumothorax or pleural effusion is noted. Mild bilateral ill-defined airspace opacities are noted concerning for edema or possibly multifocal pneumonia. Bony thorax is unremarkable. IMPRESSION: Mild bilateral ill-defined airspace opacities are noted concerning for edema or possibly multifocal pneumonia. Followup radiographs are recommended until  resolution. Electronically Signed   By: Lupita Raider M.D.   On: 04/23/2019 10:25     Scheduled Meds: . ascorbic acid  500 mg Oral Daily  . aspirin EC  81 mg Oral Daily  . dexamethasone (DECADRON) injection  6 mg Intravenous Q24H  . enoxaparin (LOVENOX) injection  40 mg Subcutaneous Q24H  . feeding supplement (ENSURE ENLIVE)  237 mL Oral TID BM  . Ipratropium-Albuterol  1 puff Inhalation Q6H  . lisinopril  40 mg Oral Daily  . multivitamin-lutein  1 capsule Oral Daily  . OLANZapine  2.5 mg Oral QHS  . polymixin-bacitracin   Topical BID  . prazosin  5 mg Oral QHS  . sertraline  50 mg Oral Daily  . simvastatin  40 mg Oral QPM  . tiotropium  18 mcg Inhalation Daily  . white petrolatum   Topical Daily  . zinc sulfate  220 mg Oral Daily   Continuous Infusions: . remdesivir 100 mg in NS 100 mL Stopped (04/24/19 0946)     LOS: 1 day    Total time spend on discharging this patient, including the last patient exam, discussing the hospital stay, instructions for ongoing care as it relates to all pertinent caregivers, as well as preparing the medical discharge records, prescriptions, and/or referrals as applicable, is 40 minutes.   Enzo Bi, MD [facility] If 7PM-7AM, please contact night-coverage 04/24/2019, 4:59 PM

## 2019-04-24 NOTE — Progress Notes (Signed)
Attempted to call report x2 at [facility], no answer at this time.

## 2019-04-24 NOTE — H&P (Signed)
TRH H&P   Patient Demographics:    William Wilcox, is a 79 y.o. male  MRN: [id number]   DOB - Apr 29, 1940  Admit Date - 04/24/2019  Outpatient Primary MD for the patient is [facility]  Patient coming from: [facility]  No chief complaint on file.     HPI:    William Wilcox  is a 79 y.o. male, with COPD, CAD/MI, HTN who presents as a transfer from [facility] where he presented on 1/11 with with worsening shortness of breath, found to be in respiratory failure with hypoxia secondary to SARS-CoV-2 pneumonia, transferred for further management.  Patient reportedly has not felt well for several days, initially had cough productive of yellow sputum.  He denied any hemoptysis.  Denied any chest pain, palpitations, orthopnea or PND.  EMS was called to his residence, upon arrival sats were 89 to 90% on room air, placed on 2 L [place] O2 prior to transfer to the ED.  In the emergency room he tested positive for SARS-CoV-2, he was admitted to the hospitalist service, started on remdesivir, Decadron, zinc, vitamin C and eventually transferred to [facility] for further management.    Review of systems:  Review of Systems:  Constitutional: see HPI HEENT: negative for earaches, epistaxis, or sore throat Respiratory: see HPI Cardiovascular: negative for chest pain, palpitations, or syncope GU: negative for dysuria, urinary frequency, urinary urgency, hematuria Gastrointestinal: negative for abdominal pain, constipation, diarrhea, nausea or vomiting Musculoskeletal: negative for arthralgias, back pain or myalgias Neurological: negative for dizziness, headaches or weakness Behavioral/Psych: negative for suicidal or homicidal  ideation Skin:negative for rash Heme: negative for bruises Endo: negative for hair loss, weight gain/loss  With Past History of the following :   Past Medical History:  Diagnosis Date  . COPD (chronic obstructive pulmonary disease) (HCC)   . Coronary artery disease   . Hypertension   . MI (myocardial infarction) [facility])       Past Surgical History:  Procedure Laterality Date  . BACK SURGERY    . CARDIAC SURGERY     triple bypass  . HIP SURGERY Left      Social History:     Social History   Tobacco Use  . Smoking status: Current Every Day Smoker    Packs/day: 2.00    Years: 57.00    Pack  years: 114.00    Types: Cigarettes  . Smokeless tobacco: Never Used  Substance Use Topics  . Alcohol use: No    Family History :    History reviewed. No pertinent family history.   Home Medications:   Prior to Admission medications   Medication Sig Start Date End Date Taking? Authorizing Provider  acetaminophen (TYLENOL) 325 MG tablet Take 650 mg by mouth every 4 (four) hours as needed for mild pain or fever.    Yes [provider]  ALPRAZolam (XANAX) 0.25 MG tablet Take 0.25 mg by mouth at bedtime as needed for anxiety.   Yes [provider]  alum & mag hydroxide-simeth (MAALOX/MYLANTA) 200-200-20 MG/5ML suspension Take 30 mLs by mouth 4 (four) times daily as needed for indigestion or heartburn.    Yes [provider]  ammonium lactate (LAC-HYDRIN) 12 % lotion Apply 1 application topically as needed for dry skin.   Yes [provider]  apixaban (ELIQUIS) 2.5 MG TABS tablet Hold this medication, as I can not find an indication for this. 04/24/19  Yes Darlin Priestly, MD  aspirin EC 81 MG tablet Take 1 tablet (81 mg total) by mouth daily. 02/02/19 02/02/20 Yes Shaune Pollack, MD  bacitracin-polymyxin b (POLYSPORIN) ointment Apply topically 2 (two) times daily.   Yes [provider]  Cholecalciferol (VITAMIN D) 50 MCG (2000 UT) CAPS Take 2,000 Units by  mouth daily.   Yes [provider]  dexamethasone (DECADRON) 6 MG tablet Take 6 mg by mouth daily.   Yes [provider]  diphenhydrAMINE (BENADRYL) 25 MG tablet Take 25 mg by mouth every 6 (six) hours as needed for allergies.    Yes [provider]  EPINEPHrine 0.3 mg/0.3 mL IJ SOAJ injection Inject 0.3 mg into the muscle as needed for anaphylaxis.   Yes [provider]  Ipratropium-Albuterol (COMBIVENT RESPIMAT) 20-100 MCG/ACT AERS respimat Inhale 1 puff into the lungs every 6 (six) hours.   Yes [provider]  lisinopril (ZESTRIL) 20 MG tablet Take 20 mg by mouth daily. (hold for SBP <110)   Yes [provider]  loperamide (IMODIUM) 2 MG capsule Take 2 mg by mouth as needed for diarrhea or loose stools.   Yes [provider]  magnesium hydroxide (MILK OF MAGNESIA) 400 MG/5ML suspension Take 30 mLs by mouth daily as needed for mild constipation.   Yes [provider]  Melatonin 3 MG TABS Take 6 mg by mouth at bedtime.   Yes [provider]  multivitamin-lutein (OCUVITE-LUTEIN) CAPS capsule Take 1 capsule by mouth daily.   Yes [provider]  nitroGLYCERIN (NITROSTAT) 0.4 MG SL tablet Place 0.4 mg under the tongue every 5 (five) minutes as needed for chest pain.   Yes [provider]  OLANZapine (ZYPREXA) 2.5 MG tablet Take 2.5 mg by mouth at bedtime.   Yes [provider]  prazosin (MINIPRESS) 5 MG capsule Take 5 mg by mouth at bedtime.   Yes [provider]  sertraline (ZOLOFT) 100 MG tablet Take 100 mg by mouth daily.    Yes [provider]  simvastatin (ZOCOR) 40 MG tablet Take 40 mg by mouth at bedtime.    Yes [provider]  tiotropium (SPIRIVA) 18 MCG inhalation capsule Hold while inpatient. 04/24/19  Yes Darlin Priestly, MD  traMADol (ULTRAM) 50 MG tablet Take 50 mg by mouth every 12 (twelve) hours as needed for moderate pain.    Yes [provider]   traZODone (DESYREL) 50  MG tablet Take 50 mg by mouth at bedtime.    Yes [provider]  triamcinolone cream (KENALOG) 0.1 % Apply 1 application topically 2 (two) times daily.    Yes [provider]  trolamine salicylate (ASPERCREME) 10 % cream Apply 1 application topically as needed for muscle pain.   Yes [provider]  ascorbic acid (VITAMIN C) 500 MG/5ML syrup Take 5 mLs (500 mg total) by mouth daily. 04/25/19   Darlin Priestly, MD  chlorpheniramine-HYDROcodone (TUSSIONEX) 10-8 MG/5ML SUER Take 5 mLs by mouth every 12 (twelve) hours as needed for cough. 04/24/19   Darlin Priestly, MD  dexamethasone (DECADRON) 10 MG/ML injection Inject 0.6 mLs (6 mg total) into the vein daily. 04/25/19   Darlin Priestly, MD  diphenhydrAMINE (BENADRYL) 2 % cream Apply 1 application topically 2 (two) times daily. (apply to arms and lower back)    [provider]  guaiFENesin-dextromethorphan (ROBITUSSIN DM) 100-10 MG/5ML syrup Take 15 mLs by mouth every 6 (six) hours as needed for cough. 04/24/19   Darlin Priestly, MD  ipratropium-albuterol (DUONEB) 0.5-2.5 (3) MG/3ML SOLN Hold while inpatient. 04/24/19   Darlin Priestly, MD  zinc sulfate 220 (50 Zn) MG capsule Take 1 capsule (220 mg total) by mouth daily. 04/25/19   Darlin Priestly, MD     Allergies:    No Known Allergies   Physical Exam:   Vitals  Blood pressure (!) 157/52, pulse 67, temperature 97.6 F (36.4 C), temperature source Axillary, resp. rate 20, SpO2 97 %.  Physical Exam   Constitutional - resting comfortably, no acute distress Eyes - pupils equal round and reactive to light and accomodation, extra ocular movements intact Nose - no gross deformity or drainage Mouth - no oral lesions noted Throat - no swelling or erythema Neck - supple, no JVD   CV - (+)S1S2, no murmurs  Resp -bilateral lower lung field rales,  GI - (+)BS, soft, non-tender, non-distended Extrem - no clubbing, cyanosis, or peripheral edema  Skin - no rashes or wounds Neuro  - alert, aware, oriented to person/place/time  Psych - normal affect, no anxiety   Patient has Pressure Ulcer on Admission?: no   Data Review:    CBC Recent Labs  Lab 04/23/19 1030  WBC 7.9  HGB 11.6*  HCT 34.9*  PLT 141*  MCV 88.8  MCH 29.5  MCHC 33.2  RDW 15.1  LYMPHSABS 2.5  MONOABS 0.4  EOSABS 0.0  BASOSABS 0.0   ------------------------------------------------------------------------------------------------------------------  Chemistries  Recent Labs  Lab 04/23/19 1030  NA 141  K 4.2  CL 109  CO2 21*  GLUCOSE 122*  BUN 53*  CREATININE 1.60*  CALCIUM 8.9  AST 33  ALT 12  ALKPHOS 71  BILITOT 0.8   ------------------------------------------------------------------------------------------------------------------ estimated creatinine clearance is 32.2 mL/min (A) (by C-G formula based on SCr of 1.6 mg/dL (H)). ------------------------------------------------------------------------------------------------------------------ No results for input(s): TSH, T4TOTAL, T3FREE, THYROIDAB in the last 72 hours.  Invalid input(s): FREET3  Coagulation profile No results for input(s): INR, PROTIME in the last 168 hours. ------------------------------------------------------------------------------------------------------------------- No results for input(s): DDIMER in the last 72 hours. -------------------------------------------------------------------------------------------------------------------  Cardiac Enzymes No results for input(s): CKMB, TROPONINI, MYOGLOBIN in the last 168 hours.  Invalid input(s): CK ------------------------------------------------------------------------------------------------------------------    Component Value Date/Time   BNP 42.0 09/28/2017 2207     ---------------------------------------------------------------------------------------------------------------  Urinalysis    Component Value Date/Time   COLORURINE YELLOW (A)  04/23/2019 1708   APPEARANCEUR CLEAR (A) 04/23/2019 1708   APPEARANCEUR Hazy 02/15/2014 1709  LABSPEC 1.018 04/23/2019 1708   LABSPEC 1.028 02/15/2014 1709   PHURINE 5.0 04/23/2019 1708   GLUCOSEU NEGATIVE 04/23/2019 1708   GLUCOSEU Negative 02/15/2014 1709   HGBUR NEGATIVE 04/23/2019 1708   BILIRUBINUR NEGATIVE 04/23/2019 1708   BILIRUBINUR Negative 02/15/2014 1709   KETONESUR NEGATIVE 04/23/2019 1708   PROTEINUR NEGATIVE 04/23/2019 1708   NITRITE NEGATIVE 04/23/2019 1708   LEUKOCYTESUR NEGATIVE 04/23/2019 1708   LEUKOCYTESUR Trace 02/15/2014 1709    ----------------------------------------------------------------------------------------------------------------   Imaging Results:    DG Chest Port 1 View  Result Date: 04/23/2019 CLINICAL DATA:  Fever. EXAM: PORTABLE CHEST 1 VIEW COMPARISON:  February 01, 2019. FINDINGS: Stable cardiomediastinal silhouette. Status post coronary bypass graft. No pneumothorax or pleural effusion is noted. Mild bilateral ill-defined airspace opacities are noted concerning for edema or possibly multifocal pneumonia. Bony thorax is unremarkable. IMPRESSION: Mild bilateral ill-defined airspace opacities are noted concerning for edema or possibly multifocal pneumonia. Followup radiographs are recommended until resolution. Electronically Signed   By: Marijo Conception M.D.   On: 04/23/2019 10:25    Assessment & Plan:    Principal Problem:   Acute hypoxemic respiratory failure due to severe acute respiratory syndrome coronavirus 2 (SARS-CoV-2) disease (HCC) Active Problems:   COPD (chronic obstructive pulmonary disease) (HCC)   Major depressive disorder, single episode, severe without psychotic features ([place])   Hypertension   Dyslipidemia   Tobacco use disorder   Major depressive disorder, recurrent severe without psychotic features ([place])     Acute hypoxemic respiratory failure due to SARS-CoV-2 disease/COPD: Patient with history of COPD on room air at  baseline presenting with worsening shortness of breath, found to be hypoxic with sats in the 80s, initially on 3 L has since been titrated to 11L. Date of Dx: 04/23/2019 Oxygen requirements: 11 LPM Antibiotics: Not indicated, procalcitonin and WBC WNL Diuretics: Carries diagnosis of CHF, administer Lasix and recheck Vitamin C and Zinc: Per protocol Remdesivir: Started on 1/11 Steroids: Started on 1/11 Actemra: Not given yet Convalescent Plasma: Not given yet    MDD, single episode, severe without psychotic features: Denies any suicidal homicidal ideations.  Clinically stable.  Continue Xanax Zyprexa, Zoloft    Hypertension: Blood pressures controlled.  Continue aspirin, prazosin, lisinopril    Dyslipidemia/CAD/MI: Denies any significant chest pain.  Cardiac diet.  Continue aspirin and statin.    Tobacco use disorder: 3 to 5 minutes of tobacco cessation counseling provided.  Patient is not in the precontemplative stage.  Nicotine patch while inpatient.   DVT Prophylaxis Lovenox  AM Labs Ordered, also please review Full Orders  Family Communication: Admission, patients condition and plan of care including tests being ordered have been discussed with the patient who indicate understanding and agree with the plan and Code Status.  Code Status full code  Likely DC to home  Condition GUARDED    Consults called: None  Admission status: Admit to inpatient  Time spent in minutes : 91   Peyton Bottoms M.D on 04/24/2019 at 9:57 PM  To page go to [email or web address] - password [email or web address]

## 2019-04-24 NOTE — Progress Notes (Signed)
Initial Nutrition Assessment  DOCUMENTATION CODES:   Not applicable  INTERVENTION:   Ensure Enlive po TID, each supplement provides 350 kcal and 20 grams of protein  Magic cup TID with meals, each supplement provides 290 kcal and 9 grams of protein  Dysphagia 3 diet   NUTRITION DIAGNOSIS:   Increased nutrient needs related to catabolic illness(COPD, COVID 19) as evidenced by increased estimated needs.  GOAL:   Patient will meet greater than or equal to 90% of their needs  MONITOR:   PO intake, Supplement acceptance, Labs, Weight trends, Skin, I & O's  REASON FOR ASSESSMENT:   Consult Assessment of nutrition requirement/status  ASSESSMENT:   79 y.o. male with a history of COPD, coronary disease, hypertension, MI, MDD, CHF who is admitted with COVID 19.   Unable to speak with patient as pt remains in the ED. RD will add supplements to help pt meet his estimated needs. RD will also change pt to a dysphagia 3 diet; per MD note, pt does not have his dentures and needs a soft diet. RD will follow up to obtain nutrition related history once patient admitted to the floor. Per chart, pt appears to be down ~3lbs(2%) from his last documented weight on 12/7.   Medications reviewed and include: vitamin C, aspirin, dexamethasone, lovenox, ocuvite, zinc  Labs reviewed: BUN 53(H), creat 1.60(H)   Unable to complete Nutrition-Focused physical exam at this time as pt with COVID 19.   Diet Order:   Diet Order            Diet regular Room service appropriate? Yes; Fluid consistency: Thin  Diet effective now             EDUCATION NEEDS:   Not appropriate for education at this time  Skin:     Last BM:  Unknown  Height:   Ht Readings from Last 1 Encounters:  04/23/19 5\' 5"  (1.651 m)    Weight:   Wt Readings from Last 1 Encounters:  04/23/19 60.9 kg    Ideal Body Weight:  61.8 kg  BMI:  Body mass index is 22.34 kg/m.  Estimated Nutritional Needs:   Kcal:   1700-2000kcal/day  Protein:  80-90g/day  Fluid:  >1.6L/day  06/21/19 MS, RD, LDN Pager #- [phone] Office#- [phone] After Hours Pager: [phone]

## 2019-04-24 NOTE — Progress Notes (Signed)
Pt's daughter Eunice Blase) notified when patient arrived to [facility]. Questions answered.

## 2019-04-24 NOTE — Progress Notes (Signed)
Daughter Eunice Blase would like to be updated once patient gets to [facility].

## 2019-04-24 NOTE — Progress Notes (Signed)
Patients O2 sats in 80's on 5L, non-rebreather applied and O2 sats at 93%, RRT called to bedside, MD notified.

## 2019-04-24 NOTE — ED Notes (Signed)
Report given to Adrianne RN.

## 2019-04-24 NOTE — Progress Notes (Signed)
Report given to [facility]. Patient notified of transfer.

## 2019-04-25 ENCOUNTER — Encounter (HOSPITAL_COMMUNITY): Payer: Self-pay | Admitting: Internal Medicine

## 2019-04-25 ENCOUNTER — Other Ambulatory Visit: Payer: Self-pay

## 2019-04-25 DIAGNOSIS — F322 Major depressive disorder, single episode, severe without psychotic features: Secondary | ICD-10-CM

## 2019-04-25 DIAGNOSIS — J1282 Pneumonia due to coronavirus disease 2019: Secondary | ICD-10-CM

## 2019-04-25 DIAGNOSIS — F17210 Nicotine dependence, cigarettes, uncomplicated: Secondary | ICD-10-CM

## 2019-04-25 LAB — URINE CULTURE

## 2019-04-25 LAB — CBC WITH DIFFERENTIAL/PLATELET
Abs Immature Granulocytes: 0.07 10*3/uL (ref 0.00–0.07)
Basophils Absolute: 0 10*3/uL (ref 0.0–0.1)
Basophils Relative: 0 %
Eosinophils Absolute: 0 10*3/uL (ref 0.0–0.5)
Eosinophils Relative: 0 %
HCT: 34.1 % — ABNORMAL LOW (ref 39.0–52.0)
Hemoglobin: 11.7 g/dL — ABNORMAL LOW (ref 13.0–17.0)
Immature Granulocytes: 1 %
Lymphocytes Relative: 26 %
Lymphs Abs: 2.4 10*3/uL (ref 0.7–4.0)
MCH: 30.6 pg (ref 26.0–34.0)
MCHC: 34.3 g/dL (ref 30.0–36.0)
MCV: 89.3 fL (ref 80.0–100.0)
Monocytes Absolute: 0.5 10*3/uL (ref 0.1–1.0)
Monocytes Relative: 5 %
Neutro Abs: 6.4 10*3/uL (ref 1.7–7.7)
Neutrophils Relative %: 68 %
Platelets: 144 10*3/uL — ABNORMAL LOW (ref 150–400)
RBC: 3.82 MIL/uL — ABNORMAL LOW (ref 4.22–5.81)
RDW: 15.3 % (ref 11.5–15.5)
WBC: 9.4 10*3/uL (ref 4.0–10.5)
nRBC: 0 % (ref 0.0–0.2)

## 2019-04-25 LAB — COMPREHENSIVE METABOLIC PANEL
ALT: 12 U/L (ref 0–44)
AST: 35 U/L (ref 15–41)
Albumin: 3.6 g/dL (ref 3.5–5.0)
Alkaline Phosphatase: 76 U/L (ref 38–126)
Anion gap: 12 (ref 5–15)
BUN: 60 mg/dL — ABNORMAL HIGH (ref 8–23)
CO2: 20 mmol/L — ABNORMAL LOW (ref 22–32)
Calcium: 8.9 mg/dL (ref 8.9–10.3)
Chloride: 106 mmol/L (ref 98–111)
Creatinine, Ser: 1.25 mg/dL — ABNORMAL HIGH (ref 0.61–1.24)
GFR calc Af Amer: >60 mL/min (ref >60)
GFR calc non Af Amer: 54 mL/min — ABNORMAL LOW (ref >60)
Glucose, Bld: 107 mg/dL — ABNORMAL HIGH (ref 70–99)
Potassium: 4.6 mmol/L (ref 3.5–5.1)
Sodium: 138 mmol/L (ref 135–145)
Total Bilirubin: 0.8 mg/dL (ref 0.3–1.2)
Total Protein: 7.3 g/dL (ref 6.5–8.1)

## 2019-04-25 LAB — BRAIN NATRIURETIC PEPTIDE: B Natriuretic Peptide: 47.3 pg/mL (ref 0.0–100.0)

## 2019-04-25 LAB — D-DIMER, QUANTITATIVE: D-Dimer, Quant: 0.9 ug/mL-FEU — ABNORMAL HIGH (ref 0.00–0.50)

## 2019-04-25 LAB — C-REACTIVE PROTEIN: CRP: 15 mg/dL — ABNORMAL HIGH (ref <1.0)

## 2019-04-25 LAB — FERRITIN: Ferritin: 1146 ng/mL — ABNORMAL HIGH (ref 24–336)

## 2019-04-25 LAB — GLUCOSE, CAPILLARY: Glucose-Capillary: 88 mg/dL (ref 70–99)

## 2019-04-25 MED ORDER — NICOTINE 21 MG/24HR TD PT24
21.0000 mg | MEDICATED_PATCH | Freq: Every day | TRANSDERMAL | Status: DC
  Administered 2019-04-25 – 2019-04-30 (×6): 21 mg via TRANSDERMAL
  Filled 2019-04-25 (×8): qty 1

## 2019-04-25 MED ORDER — FUROSEMIDE 10 MG/ML IJ SOLN
40.0000 mg | Freq: Once | INTRAMUSCULAR | Status: AC
  Administered 2019-04-25: 40 mg via INTRAVENOUS
  Filled 2019-04-25: qty 4

## 2019-04-25 MED ORDER — ENSURE ENLIVE PO LIQD
237.0000 mL | Freq: Two times a day (BID) | ORAL | Status: DC
  Administered 2019-04-25 (×2): 237 mL via ORAL

## 2019-04-25 MED ORDER — ENSURE ENLIVE PO LIQD
237.0000 mL | Freq: Three times a day (TID) | ORAL | Status: DC
  Administered 2019-04-25 – 2019-05-01 (×9): 237 mL via ORAL

## 2019-04-25 MED ORDER — TOCILIZUMAB 400 MG/20ML IV SOLN
400.0000 mg | Freq: Once | INTRAVENOUS | Status: AC
  Administered 2019-04-25: 400 mg via INTRAVENOUS
  Filled 2019-04-25: qty 20

## 2019-04-25 NOTE — Evaluation (Signed)
Physical Therapy Evaluation Patient Details Name: William Wilcox MRN: [id number] DOB: 1941-01-30 Today's Date: 04/25/2019   History of Present Illness  79 yo male admitted for Covid. PMH includes COPD, coronary disease, HTN, MI  Clinical Impression  Very frail male patient transferred from [facility] with noted Acute hypoxemic respiratory failure (severe). He is very cooperative, but desats easily with min exertion. He states he lives in a SNF- and "a lot of his friends also have the virus". He uses rollator for ambulation, but relates he does not use oxygen. He requires mod A for all bed mobility- posterior lean noted in sitting on edge of bed. He was unable to perform sit<>stand to attempt ambulation or pre-gait. He was instructed in use of IS, as well as basic LE ex format with good return demo. Should benefit from PT to address goals to promote optimal functional outcomes and reduce risk of falls.     Follow Up Recommendations SNF    Equipment Recommendations       Recommendations for Other Services       Precautions / Restrictions Precautions Precautions: Fall Restrictions Weight Bearing Restrictions: No Other Position/Activity Restrictions: pt on HFNC (green)-15L      Mobility  Bed Mobility   Bed Mobility: Supine to Sit;Sit to Supine     Supine to sit: Mod assist Sit to supine: Mod assist   General bed mobility comments: Could not stand fully erect, able with mod assist to scoot up in bed in seated. Desats easily- with min exertion- 74%SPO2  Transfers                    Ambulation/Gait             General Gait Details: Could not stand to attempt any ambulation during PT evaluation  Stairs            Wheelchair Mobility    Modified Rankin (Stroke Patients Only)       Balance Overall balance assessment: Needs assistance Sitting-balance support: Bilateral upper extremity supported Sitting balance-Leahy Scale: Fair   Postural control: Posterior  lean                                   Pertinent Vitals/Pain Pain Assessment: 0-10 Pain Score: 5  Pain Location: Mid to low back. He is very frail. Pain Descriptors / Indicators: Aching;Heaviness    Home Living Family/patient expects to be discharged to:: Assisted living               Home Equipment: Walker - 4 wheels      Prior Function Level of Independence: Independent               Hand Dominance   Dominant Hand: Right    Extremity/Trunk Assessment   Upper Extremity Assessment Upper Extremity Assessment: Defer to OT evaluation    Lower Extremity Assessment Lower Extremity Assessment: Defer to PT evaluation;Generalized weakness    Cervical / Trunk Assessment Cervical / Trunk Assessment: Kyphotic  Communication   Communication: No difficulties  Cognition Arousal/Alertness: Awake/alert Behavior During Therapy: WFL for tasks assessed/performed Overall Cognitive Status: Difficult to assess(Cannot confirm details from baseline- but pleasant and cooperative.)                                 General Comments: Was accurate re orientation to self,  that he was in the hospital - but not sure about month, day or year.      General Comments      Exercises General Exercises - Lower Extremity Ankle Circles/Pumps: AROM;Supine;Both;AAROM Heel Slides: AROM;AAROM;Supine Hip ABduction/ADduction: AROM;AAROM;Both Other Exercises Other Exercises: Instructed in use of IS and was able to achieve 750, 5 out of 8 attempts/    Reminded of pursed lip breathing , esp helpful during recovery.   Assessment/Plan    PT Assessment Patient needs continued PT services  PT Problem List Decreased strength;Decreased mobility;Decreased safety awareness;Decreased range of motion;Decreased activity tolerance;Decreased balance;Cardiopulmonary status limiting activity;Decreased knowledge of use of DME       PT Treatment Interventions Gait training;DME  instruction;Neuromuscular re-education;Functional mobility training;Patient/family education;Therapeutic activities;Therapeutic exercise    PT Goals (Current goals can be found in the Care Plan section)  Acute Rehab PT Goals Patient Stated Goal: Wants to go back home (SNF) to be with my friends, but able to do like I was before PT Goal Formulation: With patient Time For Goal Achievement: 05/09/19 Potential to Achieve Goals: Fair    Frequency Min 2X/week   Barriers to discharge        Co-evaluation               AM-PAC PT "6 Clicks" Mobility  Outcome Measure Help needed turning from your back to your side while in a flat bed without using bedrails?: A Lot Help needed moving from lying on your back to sitting on the side of a flat bed without using bedrails?: A Lot Help needed moving to and from a bed to a chair (including a wheelchair)?: A Lot Help needed standing up from a chair using your arms (e.g., wheelchair or bedside chair)?: A Lot Help needed to walk in hospital room?: A Lot Help needed climbing 3-5 steps with a railing? : Total 6 Click Score: 11    End of Session Equipment Utilized During Treatment: Oxygen Activity Tolerance: Patient limited by fatigue Patient left: in bed;with call bell/phone within reach Nurse Communication: Mobility status PT Visit Diagnosis: Muscle weakness (generalized) (M62.81);Difficulty in walking, not elsewhere classified (R26.2)    Time: 0110-0200 PT Time Calculation (min) (ACUTE ONLY): 50 min   Charges:   PT Evaluation $PT Eval High Complexity: 1 High PT Treatments $Therapeutic Exercise: 8-22 mins $Therapeutic Activity: 8-22 mins      William Wilcox, PT # [phone] CGV cell  }Kristelle Cavallaro 04/25/2019, 4:15 PM

## 2019-04-25 NOTE — Progress Notes (Signed)
Attempted to reach Blain Pais to update condition. Pt requiring 6 liters [place] on hiflow, spo2 84-92% on phleth to left ear for best reading. actemra and 3rd dose of remdesivir given today. Pt appear with less respiratory distress from this am. Will continue to encourage IS and acupella.

## 2019-04-25 NOTE — Progress Notes (Addendum)
Initial Nutrition Assessment  DOCUMENTATION CODES:   Not applicable  INTERVENTION:   Downgrade diet to dysphagia 3 for softer foods.  Ensure Enlive po TID, each supplement provides 350 kcal and 20 grams of protein.  Magic cup BID with lunch and dinner, each supplement provides 290 kcal and 9 grams of protein.   NUTRITION DIAGNOSIS:   Increased nutrient needs related to acute illness, chronic illness(COVID-19, COPD) as evidenced by estimated needs.  GOAL:   Patient will meet greater than or equal to 90% of their needs  MONITOR:   PO intake, Supplement acceptance, Labs, Weight trends  REASON FOR ASSESSMENT:   Malnutrition Screening Tool    ASSESSMENT:   79 yo male admitted with fever, cough, SOB r/t COVID-19 viral PNA. PMH includes COPD, CAD, HTN.  Patient is currently on a heart healthy CHO modified diet. He is consuming 0% of meals. Per RD note at [facility] 1/12, patient needed a soft diet because he did not have his dentures. Will change diet to dysphagia 3.   Labs reviewed.  CBG's: 88  Medications reviewed and include vitamin C, decadron, colace, zinc sulfate, remdesivir.   Patient has lost 6.5% of usual weight within the past 2 months. He is at increased nutrition risk, given recent weight loss and current illness causing increased nutrient requirements.   NUTRITION - FOCUSED PHYSICAL EXAM:  unable to complete  Diet Order:   Diet Order            Diet heart healthy/carb modified Room service appropriate? Yes; Fluid consistency: Thin  Diet effective now              EDUCATION NEEDS:   Not appropriate for education at this time  Skin:  Skin Assessment: Reviewed RN Assessment  Last BM:  1/11  Height:   Ht Readings from Last 1 Encounters:  04/25/19 5\' 5"  (1.651 m)    Weight:   Wt Readings from Last 1 Encounters:  04/25/19 57 kg   Ideal Body Weight:  61.8 kg  BMI:  Body mass index is 20.91 kg/m.  Estimated Nutritional Needs:   Kcal:   1700-2000  Protein:  80-90 gm  Fluid:  >/= 1.7 L    04/27/19, RD, LDN, CNSC Pager [phone] After Hours Pager [phone]

## 2019-04-25 NOTE — Progress Notes (Signed)
Received call back from [place] social services-spoke with Haywood Lasso with the patient's legal guardian-I have explained the rationale, risks, benefits of off label use of Actemra-she consents.  Also noted in my prior note-patient and the patient's daughter have consented as well.  The rationale for the off label use of Actemra its known side effects, potential benefits was  discussed with patient .The use of Actemra is based on published clinical articles/anecdotal data as several randomized trials have been negative, but lately there have been a few positive randomized trials as well.  There is no history of tuberculosis, hepatitis B or C.  Complete risks and long-term side effects are unknown, however in the best clinical judgment it is felt that the clinical benefit at this time outweighs medical risks given tenuous clinical state of the patient.  Patient agrees with the treatment plan and consent to the use of Actemra.

## 2019-04-25 NOTE — Progress Notes (Signed)
PROGRESS NOTE                                                                                                                                                                                                             Patient Demographics:    William Wilcox, is a 79 y.o. male, DOB - Apr 05, 1941, QAS:[id number]  Outpatient Primary MD for the patient is [place] date - 04/24/2019   LOS - 1  No chief complaint on file.      Brief Narrative: Patient is a 79 y.o. male with PMHx of COPD, CAD, HTN who presented to [facility] on 1/11 with fever, cough, shortness of breath-he initially was on 3 L of oxygen-however post admission-his hypoxemia worsened needing 15 L of HFNC-patient was subsequently transferred to [facility] on 1/12.  See below for further details.   Subjective:    William Wilcox today remains stable at rest-requiring anywhere from 6-7 L of high flow oxygen.   Assessment  & Plan :   Acute Hypoxic Resp Failure due to Covid 19 Viral pneumonia: Remains very tenuous-continues to require 6-7 L of oxygen.  CRP is significantly elevated-procalcitonin is negative-spoke to patient about off label use of Actemra-he denies any history of hepatitis B tuberculosis.  Chart reviewed-no prior history of TB or hepatitis B as well.  Spoke with patient's daughter-who has consented to the use of Actemra-however she tells me that patient has a legal guardian as well.  I have tried calling multiple numbers-have subsequently asked the social work/case management to see if I can get in touch with the legal guardian.  In the meantime-continue with steroids and remdesivir.  Fever: afebrile  O2 requirements:  SpO2: 98 % O2 Flow Rate (L/min): 4 L/min   COVID-19 Labs: Recent Labs    04/25/19 0125  DDIMER 0.90*  FERRITIN 1,146*  CRP 15.0*       Component Value Date/Time   BNP 42.0 09/28/2017 2207    Recent  Labs  Lab 04/23/19 1708  PROCALCITON <0.10    Lab Results  Component Value Date   SARSCOV2NAA NEGATIVE 02/01/2019     COVID-19 Medications: Steroids: 1/11>> Remdesivir: 1/11>>  Other medications: Diuretics:Euvolemic we will give 1 dose of Lasix to maintain negative balance. Antibiotics:Not needed as no evidence of bacterial infection  Prone/Incentive Spirometry:  encouraged patient to lie prone for 3-4 hours at a time for a total of 16 hours a day, and to encourage incentive spirometry use 3-4/hour.  DVT Prophylaxis  :  Lovenox   AKI on CKD stage IIIa: AKI likely hemodynamically mediated-improved with supportive care.  Chronic diastolic heart failure: Euvolemic-monitor volume status closely.  COPD: Not in exacerbation-continue inhalers  History of CAD s/p CABG: No anginal symptoms-continue aspirin and statin  HTN: Controlled-continue lisinopril  Depression/anxiety: Appears stable-continue Zyprexa, Zoloft trazodone and Xanax.  Deconditioning/debility: Appears very frail-has significant atrophy of his calf muscles.  Not sure what his ambulatory status is baseline is.  Obtaining PT/OT.  Consults  :  None  Procedures  :  None  ABG:    Component Value Date/Time   HCO3 22.4 04/23/2019 1031   ACIDBASEDEF 1.1 04/23/2019 1031   O2SAT 93.5 04/23/2019 1031    Vent Settings: N/A   Condition - Extremely Guarded  Family Communication  :  Daughter updated over the phone-attempted to call legal guardian multiple times-but all the telephone numbers appear incorrect-have reached out to the social work.  Code Status :  Full Code  Diet :  Diet Order            Diet heart healthy/carb modified Room service appropriate? Yes; Fluid consistency: Thin  Diet effective now               Disposition Plan  :  Remain hospitalized-back to SNF when he is ready for discharge.  Barriers to discharge: Hypoxia requiring O2 supplementation/complete 5 days of IV  Remdesivir  Antimicorbials  :    Anti-infectives (From admission, onward)   Start     Dose/Rate Route Frequency Ordered Stop   04/25/19 1000  remdesivir 100 mg in sodium chloride 0.9 % 100 mL IVPB     100 mg 200 mL/hr over 30 Minutes Intravenous Daily 04/24/19 1921 04/28/19 0959      Inpatient Medications  Scheduled Meds: . vitamin C  500 mg Oral Daily  . aspirin EC  81 mg Oral Daily  . dexamethasone  6 mg Oral Q24H  . docusate sodium  100 mg Oral Daily  . enoxaparin (LOVENOX) injection  40 mg Subcutaneous Q24H  . feeding supplement (ENSURE ENLIVE)  237 mL Oral BID BM  . Ipratropium-Albuterol  1 puff Inhalation Q6H  . lisinopril  20 mg Oral Daily  . Melatonin  6 mg Oral QHS  . nicotine  21 mg Transdermal Daily  . OLANZapine  2.5 mg Oral QHS  . prazosin  5 mg Oral QHS  . sertraline  100 mg Oral Daily  . simvastatin  40 mg Oral QHS  . traZODone  50 mg Oral QHS  . umeclidinium bromide  1 puff Inhalation Daily  . zinc sulfate  220 mg Oral Daily   Continuous Infusions: . remdesivir 100 mg in NS 100 mL     PRN Meds:.acetaminophen, ALPRAZolam, chlorpheniramine-HYDROcodone, guaiFENesin-dextromethorphan, loperamide, nitroGLYCERIN, ondansetron **OR** ondansetron (ZOFRAN) IV, traMADol   Time Spent in minutes 35   See all Orders from today for further details   Jeoffrey Massed M.D on 04/25/2019 at 7:36 AM  To page go to [email or web address] - use universal password  [facility] -  Office  [phone]    Objective:   Vitals:   04/25/19 0531 04/25/19 0541 04/25/19 0542 04/25/19 0630  BP:   (!) 131/52   Pulse: 83  81   Resp: 20  (!) 22   Temp:   98.2  F (36.8 C)   TempSrc:      SpO2: 92%  92% 98%  Weight:  57 kg    Height:        Wt Readings from Last 3 Encounters:  04/25/19 57 kg  04/23/19 60.9 kg  02/18/19 60.9 kg     Intake/Output Summary (Last 24 hours) at 04/25/2019 0736 Last data filed at 04/25/2019 0459 Gross per 24 hour  Intake 240 ml  Output 875 ml   Net -635 ml     Physical Exam Gen Exam:Alert awake-not in any distress HEENT:atraumatic, normocephalic Chest: B/L clear to auscultation anteriorly CVS:S1S2 regular Abdomen:soft non tender, non distended Extremities:no edema Neurology: Non focal Skin: no rash   Data Review:    CBC Recent Labs  Lab 04/23/19 1030 04/25/19 0125  WBC 7.9 9.4  HGB 11.6* 11.7*  HCT 34.9* 34.1*  PLT 141* 144*  MCV 88.8 89.3  MCH 29.5 30.6  MCHC 33.2 34.3  RDW 15.1 15.3  LYMPHSABS 2.5 2.4  MONOABS 0.4 0.5  EOSABS 0.0 0.0  BASOSABS 0.0 0.0    Chemistries  Recent Labs  Lab 04/23/19 1030 04/25/19 0125  NA 141 138  K 4.2 4.6  CL 109 106  CO2 21* 20*  GLUCOSE 122* 107*  BUN 53* 60*  CREATININE 1.60* 1.25*  CALCIUM 8.9 8.9  AST 33 35  ALT 12 12  ALKPHOS 71 76  BILITOT 0.8 0.8   ------------------------------------------------------------------------------------------------------------------ No results for input(s): CHOL, HDL, LDLCALC, TRIG, CHOLHDL, LDLDIRECT in the last 72 hours.  No results found for: HGBA1C ------------------------------------------------------------------------------------------------------------------ No results for input(s): TSH, T4TOTAL, T3FREE, THYROIDAB in the last 72 hours.  Invalid input(s): FREET3 ------------------------------------------------------------------------------------------------------------------ Recent Labs    04/25/19 0125  FERRITIN 1,146*    Coagulation profile No results for input(s): INR, PROTIME in the last 168 hours.  Recent Labs    04/25/19 0125  DDIMER 0.90*    Cardiac Enzymes No results for input(s): CKMB, TROPONINI, MYOGLOBIN in the last 168 hours.  Invalid input(s): CK ------------------------------------------------------------------------------------------------------------------    Component Value Date/Time   BNP 42.0 09/28/2017 2207    Micro Results Recent Results (from the past 240 hour(s))  Blood  Culture (routine x 2)     Status: None (Preliminary result)   Collection Time: 04/23/19 10:31 AM   Specimen: BLOOD  Result Value Ref Range Status   Specimen Description BLOOD LEFT ANTECUBITAL  Final   Special Requests   Final    BOTTLES DRAWN AEROBIC AND ANAEROBIC Blood Culture results may not be optimal due to an excessive volume of blood received in culture bottles   Culture   Final    NO GROWTH < 24 HOURS Performed at [facility], [address] [place], [place] [address]    Report Status PENDING  Incomplete  Blood Culture (routine x 2)     Status: None (Preliminary result)   Collection Time: 04/23/19 10:31 AM   Specimen: BLOOD  Result Value Ref Range Status   Specimen Description BLOOD BLOOD RIGHT FOREARM  Final   Special Requests   Final    BOTTLES DRAWN AEROBIC AND ANAEROBIC Blood Culture results may not be optimal due to an excessive volume of blood received in culture bottles   Culture   Final    NO GROWTH < 24 HOURS Performed at [facility], [address] [place], [place] [address]    Report Status PENDING  Incomplete    Radiology Reports DG Chest [facility] 1 View  Result Date:  04/23/2019 CLINICAL DATA:  Fever. EXAM: PORTABLE CHEST 1 VIEW COMPARISON:  February 01, 2019. FINDINGS: Stable cardiomediastinal silhouette. Status post coronary bypass graft. No pneumothorax or pleural effusion is noted. Mild bilateral ill-defined airspace opacities are noted concerning for edema or possibly multifocal pneumonia. Bony thorax is unremarkable. IMPRESSION: Mild bilateral ill-defined airspace opacities are noted concerning for edema or possibly multifocal pneumonia. Followup radiographs are recommended until resolution. Electronically Signed   By: Lupita Raider M.D.   On: 04/23/2019 10:25

## 2019-04-25 NOTE — TOC Progression Note (Addendum)
Transition of Care [facility]) - Progression Note    Patient Details  Name: William Wilcox MRN: [id number] Date of Birth: 1940/08/19  Transition of Care [facility]) CM/SW Contact  Golda Acre, RN Phone Number: 04/25/2019, 12:00 PM  Clinical Narrative:    tcf-md-number for legal guardian in chart is incorrect/.tct [place] [facility] patient is from legal guardian is Alexais with the [place] sss-[phone]/tct-to that number no answer/tct-D.CAtes daughter-verified number and rec'd number for the [place] [facility] office of [phone]/tct-that number busy. tcf-Alexais with  social services correct phone number is [phone].  Sent to md via amion and chart.       Expected Discharge Plan and Services                                                 Social Determinants of Health (SDOH) Interventions    Readmission Risk Interventions No flowsheet data found.

## 2019-04-26 LAB — COMPREHENSIVE METABOLIC PANEL
ALT: 14 U/L (ref 0–44)
AST: 29 U/L (ref 15–41)
Albumin: 3.1 g/dL — ABNORMAL LOW (ref 3.5–5.0)
Alkaline Phosphatase: 72 U/L (ref 38–126)
Anion gap: 11 (ref 5–15)
BUN: 82 mg/dL — ABNORMAL HIGH (ref 8–23)
CO2: 21 mmol/L — ABNORMAL LOW (ref 22–32)
Calcium: 8.7 mg/dL — ABNORMAL LOW (ref 8.9–10.3)
Chloride: 106 mmol/L (ref 98–111)
Creatinine, Ser: 1.76 mg/dL — ABNORMAL HIGH (ref 0.61–1.24)
GFR calc Af Amer: 42 mL/min — ABNORMAL LOW (ref >60)
GFR calc non Af Amer: 36 mL/min — ABNORMAL LOW (ref >60)
Glucose, Bld: 138 mg/dL — ABNORMAL HIGH (ref 70–99)
Potassium: 4.5 mmol/L (ref 3.5–5.1)
Sodium: 138 mmol/L (ref 135–145)
Total Bilirubin: 0.6 mg/dL (ref 0.3–1.2)
Total Protein: 6.5 g/dL (ref 6.5–8.1)

## 2019-04-26 LAB — CBC WITH DIFFERENTIAL/PLATELET
Abs Immature Granulocytes: 0.07 10*3/uL (ref 0.00–0.07)
Basophils Absolute: 0 10*3/uL (ref 0.0–0.1)
Basophils Relative: 0 %
Eosinophils Absolute: 0 10*3/uL (ref 0.0–0.5)
Eosinophils Relative: 0 %
HCT: 31.6 % — ABNORMAL LOW (ref 39.0–52.0)
Hemoglobin: 10.8 g/dL — ABNORMAL LOW (ref 13.0–17.0)
Immature Granulocytes: 1 %
Lymphocytes Relative: 27 %
Lymphs Abs: 2.6 10*3/uL (ref 0.7–4.0)
MCH: 30.5 pg (ref 26.0–34.0)
MCHC: 34.2 g/dL (ref 30.0–36.0)
MCV: 89.3 fL (ref 80.0–100.0)
Monocytes Absolute: 0.3 10*3/uL (ref 0.1–1.0)
Monocytes Relative: 3 %
Neutro Abs: 6.8 10*3/uL (ref 1.7–7.7)
Neutrophils Relative %: 69 %
Platelets: 166 10*3/uL (ref 150–400)
RBC: 3.54 MIL/uL — ABNORMAL LOW (ref 4.22–5.81)
RDW: 15.3 % (ref 11.5–15.5)
WBC: 9.8 10*3/uL (ref 4.0–10.5)
nRBC: 0 % (ref 0.0–0.2)

## 2019-04-26 LAB — FERRITIN: Ferritin: 1063 ng/mL — ABNORMAL HIGH (ref 24–336)

## 2019-04-26 LAB — D-DIMER, QUANTITATIVE: D-Dimer, Quant: 0.67 ug/mL-FEU — ABNORMAL HIGH (ref 0.00–0.50)

## 2019-04-26 LAB — C-REACTIVE PROTEIN: CRP: 19.9 mg/dL — ABNORMAL HIGH (ref <1.0)

## 2019-04-26 MED ORDER — METHYLPREDNISOLONE SODIUM SUCC 40 MG IJ SOLR
40.0000 mg | Freq: Two times a day (BID) | INTRAMUSCULAR | Status: DC
  Administered 2019-04-27 – 2019-04-29 (×5): 40 mg via INTRAVENOUS
  Filled 2019-04-26 (×5): qty 1

## 2019-04-26 NOTE — Progress Notes (Signed)
Pt alert this am but lethargic through most of morning. Sipping on soft drinks and refusing food intake. Small amt of yellow bile emesis 30 mins following am medications, does not want antiemetic. 6 to 8 liters of O2 support via hiflow cannula, pulse ox readings very labile, 70's to low 90's. SOB with prolong conversations. Daughter, Eunice Blase, called for updates.

## 2019-04-26 NOTE — TOC Initial Note (Addendum)
Transition of Care [facility]) - Initial/Assessment Note    Patient Details  Name: DWIGHT ADAMCZAK MRN: [id number] Date of Birth: [birth year]  [facility]) CM/SW Contact:    Golda Acre, RN Phone Number: 04/26/2019, 11:57 AM  Clinical Narrative:                 fl2 prepared for possible need of snf. passar number is [phone] A LGX:[phone] fl2 sent via fax via hub.  decision to allow to return to [facility] [place] vs snf not made at this time. Legal guardian Jon Gills at [phone] will be notified when decision made.        Patient Goals and CMS Choice        Expected Discharge Plan and Services                                                Prior Living Arrangements/Services                       Activities of Daily Living Home Assistive Devices/Equipment: None ADL Screening (condition at time of admission) Patient's cognitive ability adequate to safely complete daily activities?: Yes Is the patient deaf or have difficulty hearing?: No Does the patient have difficulty seeing, even when wearing glasses/contacts?: No Does the patient have difficulty concentrating, remembering, or making decisions?: No Patient able to express need for assistance with ADLs?: No Does the patient have difficulty dressing or bathing?: Yes Independently performs ADLs?: No Communication: Independent Dressing (OT): Appropriate for developmental age Grooming: Independent Feeding: Independent Bathing: Needs assistance Is this a change from baseline?: Change from baseline, expected to last <3 days Toileting: Needs assistance Is this a change from baseline?: Change from baseline, expected to last <3 days In/Out Bed: Needs assistance Is this a change from baseline?: Change from baseline, expected to last <3 days Does the patient have difficulty walking or climbing stairs?: No Weakness of Legs: Both Weakness of Arms/Hands: None  Permission  Sought/Granted                  Emotional Assessment              Admission diagnosis:  Acute hypoxemic respiratory failure due to severe acute respiratory syndrome coronavirus 2 (SARS-CoV-2) disease (HCC) [U07.1, J96.01] Patient Active Problem List   Diagnosis Date Noted  . Acute hypoxemic respiratory failure (HCC) 04/24/2019  . Acute hypoxemic respiratory failure due to severe acute respiratory syndrome coronavirus 2 (SARS-CoV-2) disease (HCC) 04/24/2019  . Coronary artery disease   . COVID-19 04/23/2019  . Chest pain 02/01/2019  . Leg pain 02/09/2017  . Lower extremity pain, bilateral 10/04/2016  . Dyslipidemia 04/14/2015  . Tobacco use disorder 04/14/2015  . 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 Major depressive disorder, single episode, severe without psychotic features (HCC) 04/13/2015  . COPD (chronic obstructive pulmonary disease) (HCC) 04/13/2015  . Chronic hip pain 04/13/2015  . Hypertension 04/13/2015   PCP:  [facility]:   [facility] [place], [place] - [address] [place] [address] Phone: [phone] Fax: [phone]     Social Determinants of Health (SDOH) Interventions    Readmission Risk Interventions No flowsheet data found.

## 2019-04-26 NOTE — Progress Notes (Addendum)
PROGRESS NOTE                                                                                                                                                                                                             Patient Demographics:    William Wilcox, is a 79 y.o. male, DOB - 1940-11-19, WER:[id number]  Outpatient Primary MD for the patient is [facility]   Admit date - 04/24/2019   LOS - 2  No chief complaint on file.      Brief Narrative: Patient is a 79 y.o. male with PMHx of COPD, CAD, HTN who presented to [facility] on 1/11 with fever, cough, shortness of breath-he initially was on 3 L of oxygen-however post admission-his hypoxemia worsened needing 15 L of HFNC-patient was subsequently transferred to [facility] on 1/12.  See below for further details.   Subjective:    William Wilcox today remains around 6 L of oxygen.  He claims he feels better than yesterday-he appears stable without any distress.   Assessment  & Plan :   Acute Hypoxic Resp Failure due to Covid 19 Viral pneumonia: Remains stable on 6 L of oxygen.  CRP continues to trend up even being s/p Actemra on 1/13.  Will change Decadron to Solu-Medrol.  Continue with remdesivir.  Continue to follow closely.  Fever: afebrile  O2 requirements:  SpO2: 96 % O2 Flow Rate (L/min): 6 L/min   COVID-19 Labs: Recent Labs    04/25/19 0125 04/26/19 0103  DDIMER 0.90* 0.67*  FERRITIN 1,146* 1,063*  CRP 15.0* 19.9*       Component Value Date/Time   BNP 47.3 04/25/2019 1315    Recent Labs  Lab 04/23/19 1708  PROCALCITON <0.10    Lab Results  Component Value Date   SARSCOV2NAA NEGATIVE 02/01/2019     COVID-19 Medications: Steroids: 1/11>> Remdesivir: 1/11>> Actemra: 1/13 x 1  Other medications: Diuretics:Euvolemic -hold Lasix today. Antibiotics:Not needed as no evidence of bacterial infection  Prone/Incentive Spirometry:  encouraged patient to lie prone for 3-4 hours at a time for a total of 16 hours a day, and to encourage incentive spirometry use 3-4/hour.  DVT Prophylaxis  :  Lovenox   AKI on CKD stage IIIa: AKI likely hemodynamically mediated-slightly worse compared to yesterday-continue supportive care.  Chronic diastolic heart failure: Euvolemic-monitor volume  status closely.  COPD: Not in exacerbation-continue inhalers  History of CAD s/p CABG: No anginal symptoms-continue aspirin and statin  HTN: Controlled-hold lisinopril due to AKI-follow for now  Depression/anxiety: Appears stable-continue Zyprexa, Zoloft trazodone and Xanax.  Deconditioning/debility: Appears very frail-has significant atrophy of his calf muscles.  Not sure what his ambulatory status is baseline is.  Obtaining PT/OT.  Consults  :  None  Procedures  :  None  ABG:    Component Value Date/Time   HCO3 22.4 04/23/2019 1031   ACIDBASEDEF 1.1 04/23/2019 1031   O2SAT 93.5 04/23/2019 1031    Vent Settings: N/A   Condition - Extremely Guarded  Family Communication  :  Daughter updated over the phone-1/14.  Please note patient has a legal guardian at [facility] social services-I spoke to yesterday Shirlyn Goltz [phone])  Code Status :  Full Code  Diet :  Diet Order            DIET DYS 3 Room service appropriate? Yes; Fluid consistency: Thin  Diet effective now               Disposition Plan  :  Remain hospitalized-back to SNF when he is ready for discharge.  Barriers to discharge: Hypoxia requiring O2 supplementation/complete 5 days of IV Remdesivir  Antimicorbials  :    Anti-infectives (From admission, onward)   Start     Dose/Rate Route Frequency Ordered Stop   04/25/19 1000  remdesivir 100 mg in sodium chloride 0.9 % 100 mL IVPB     100 mg 200 mL/hr over 30 Minutes Intravenous Daily 04/24/19 1921 04/28/19 0959      Inpatient Medications  Scheduled Meds: . vitamin C  500 mg Oral Daily  .  aspirin EC  81 mg Oral Daily  . dexamethasone  6 mg Oral Q24H  . docusate sodium  100 mg Oral Daily  . enoxaparin (LOVENOX) injection  40 mg Subcutaneous Q24H  . feeding supplement (ENSURE ENLIVE)  237 mL Oral TID BM  . Ipratropium-Albuterol  1 puff Inhalation Q6H  . lisinopril  20 mg Oral Daily  . Melatonin  6 mg Oral QHS  . nicotine  21 mg Transdermal Daily  . OLANZapine  2.5 mg Oral QHS  . prazosin  5 mg Oral QHS  . sertraline  100 mg Oral Daily  . simvastatin  40 mg Oral QHS  . traZODone  50 mg Oral QHS  . umeclidinium bromide  1 puff Inhalation Daily  . zinc sulfate  220 mg Oral Daily   Continuous Infusions: . remdesivir 100 mg in NS 100 mL 100 mg (04/26/19 0909)   PRN Meds:.acetaminophen, ALPRAZolam, chlorpheniramine-HYDROcodone, guaiFENesin-dextromethorphan, loperamide, nitroGLYCERIN, ondansetron **OR** ondansetron (ZOFRAN) IV, traMADol   Time Spent in minutes 35   See all Orders from today for further details   Jeoffrey Massed M.D on 04/26/2019 at 12:24 PM  To page go to [email or web address] - use universal password  [facility] -  Office  [phone]    Objective:   Vitals:   04/26/19 0800 04/26/19 0830 04/26/19 0838 04/26/19 1200  BP: (!) 93/43 (!) 93/43  (!) 92/40  Pulse: 70 80 69 66  Resp: (!) 24 (!) 22 19 (!) 24  Temp: 97.6 F (36.4 C)   (!) 97.3 F (36.3 C)  TempSrc: Axillary   Axillary  SpO2: 93% (!) 80% (!) 86% 96%  Weight:      Height:        Wt Readings from Last 3 Encounters:  04/25/19 57 kg  04/23/19 60.9 kg  02/18/19 60.9 kg     Intake/Output Summary (Last 24 hours) at 04/26/2019 1224 Last data filed at 04/26/2019 0909 Gross per 24 hour  Intake 850 ml  Output 850 ml  Net 0 ml     Physical Exam Gen Exam:Alert awake-not in any distress. Chronically sick appearing HEENT:atraumatic, normocephalic Chest: B/L clear to auscultation anteriorly CVS:S1S2 regular Abdomen:soft non tender, non distended Extremities:no edema Neurology: Non  focal-but weak Skin: no rash   Data Review:    CBC Recent Labs  Lab 04/23/19 1030 04/25/19 0125 04/26/19 0103  WBC 7.9 9.4 9.8  HGB 11.6* 11.7* 10.8*  HCT 34.9* 34.1* 31.6*  PLT 141* 144* 166  MCV 88.8 89.3 89.3  MCH 29.5 30.6 30.5  MCHC 33.2 34.3 34.2  RDW 15.1 15.3 15.3  LYMPHSABS 2.5 2.4 2.6  MONOABS 0.4 0.5 0.3  EOSABS 0.0 0.0 0.0  BASOSABS 0.0 0.0 0.0    Chemistries  Recent Labs  Lab 04/23/19 1030 04/25/19 0125 04/26/19 0103  NA 141 138 138  K 4.2 4.6 4.5  CL 109 106 106  CO2 21* 20* 21*  GLUCOSE 122* 107* 138*  BUN 53* 60* 82*  CREATININE 1.60* 1.25* 1.76*  CALCIUM 8.9 8.9 8.7*  AST 33 35 29  ALT 12 12 14   ALKPHOS 71 76 72  BILITOT 0.8 0.8 0.6   ------------------------------------------------------------------------------------------------------------------ No results for input(s): CHOL, HDL, LDLCALC, TRIG, CHOLHDL, LDLDIRECT in the last 72 hours.  No results found for: HGBA1C ------------------------------------------------------------------------------------------------------------------ No results for input(s): TSH, T4TOTAL, T3FREE, THYROIDAB in the last 72 hours.  Invalid input(s): FREET3 ------------------------------------------------------------------------------------------------------------------ Recent Labs    04/25/19 0125 04/26/19 0103  FERRITIN 1,146* 1,063*    Coagulation profile No results for input(s): INR, PROTIME in the last 168 hours.  Recent Labs    04/25/19 0125 04/26/19 0103  DDIMER 0.90* 0.67*    Cardiac Enzymes No results for input(s): CKMB, TROPONINI, MYOGLOBIN in the last 168 hours.  Invalid input(s): CK ------------------------------------------------------------------------------------------------------------------    Component Value Date/Time   BNP 47.3 04/25/2019 1315    Micro Results Recent Results (from the past 240 hour(s))  Blood Culture (routine x 2)     Status: None (Preliminary result)    Collection Time: 04/23/19 10:31 AM   Specimen: BLOOD  Result Value Ref Range Status   Specimen Description BLOOD LEFT ANTECUBITAL  Final   Special Requests   Final    BOTTLES DRAWN AEROBIC AND ANAEROBIC Blood Culture results may not be optimal due to an excessive volume of blood received in culture bottles   Culture   Final    NO GROWTH 3 DAYS Performed at [facility], [address], [place], [place]    Report Status PENDING  Incomplete  Blood Culture (routine x 2)     Status: None (Preliminary result)   Collection Time: 04/23/19 10:31 AM   Specimen: BLOOD  Result Value Ref Range Status   Specimen Description BLOOD BLOOD RIGHT FOREARM  Final   Special Requests   Final    BOTTLES DRAWN AEROBIC AND ANAEROBIC Blood Culture results may not be optimal due to an excessive volume of blood received in culture bottles   Culture   Final    NO GROWTH 3 DAYS Performed at [facility], [address] [place], [place]    Report Status PENDING  Incomplete  Urine culture     Status: Abnormal   Collection Time: 04/23/19  5:08  PM   Specimen: In/Out Cath Urine  Result Value Ref Range Status   Specimen Description   Final    IN/OUT CATH URINE Performed at [facility], [address], [place], [place] [address]    Special Requests   Final    NONE Performed at [facility], [address] [place], [place] [address]    Culture MULTIPLE SPECIES PRESENT, SUGGEST RECOLLECTION (A)  Final   Report Status 04/25/2019 FINAL  Final    Radiology Reports DG Chest Port 1 View  Result Date: 04/23/2019 CLINICAL DATA:  Fever. EXAM: PORTABLE CHEST 1 VIEW COMPARISON:  February 01, 2019. FINDINGS: Stable cardiomediastinal silhouette. Status post coronary bypass graft. No pneumothorax or pleural effusion is noted. Mild bilateral ill-defined airspace opacities are noted concerning for edema or possibly multifocal pneumonia. Bony thorax is unremarkable.  IMPRESSION: Mild bilateral ill-defined airspace opacities are noted concerning for edema or possibly multifocal pneumonia. Followup radiographs are recommended until resolution. Electronically Signed   By: Marijo Conception M.D.   On: 04/23/2019 10:25

## 2019-04-26 NOTE — NC FL2 (Signed)
[place] LEVEL OF CARE SCREENING TOOL     IDENTIFICATION  Patient Name: William Wilcox Birthdate: 1940/07/03 Sex: male Admission Date (Current Location): 04/24/2019  [facility] and [place] Number:  Engineering geologist and Address:         Provider Number: [id number]  Attending Physician Name and Address:  Jonetta Osgood, MD  Relative Name and Phone Number:       Current Level of Care: Hospital Recommended Level of Care: [place] Prior Approval Number:    Date Approved/Denied:   PASRR Number: [id number]  Discharge Plan: Other (Comment)(alf)    Current Diagnoses: Patient Active Problem List   Diagnosis Date Noted  . Acute hypoxemic respiratory failure ([place]) 04/24/2019  . Acute hypoxemic respiratory failure due to severe acute respiratory syndrome coronavirus 2 (SARS-CoV-2) disease ([place]) 04/24/2019  . Coronary artery disease   . COVID-19 04/23/2019  . Chest pain 02/01/2019  . Leg pain 02/09/2017  . Lower extremity pain, bilateral 10/04/2016  . Dyslipidemia 04/14/2015  . Tobacco use disorder 04/14/2015  . 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 Major depressive disorder, single episode, severe without psychotic features ([place]) 04/13/2015  . COPD (chronic obstructive pulmonary disease) ([place]) 04/13/2015  . Chronic hip pain 04/13/2015  . Hypertension 04/13/2015    Orientation RESPIRATION BLADDER Height & Weight     Self, Time, Situation, Place  Normal Continent Weight: 57 kg Height:  5\' 5"  (165.1 cm)  BEHAVIORAL SYMPTOMS/MOOD NEUROLOGICAL BOWEL NUTRITION STATUS      Continent Diet(regular)  AMBULATORY STATUS COMMUNICATION OF NEEDS Skin   Independent Verbally Normal                       Personal Care Assistance Level of Assistance  Bathing, Feeding, Dressing Bathing Assistance: Limited assistance Feeding assistance: Limited assistance Dressing Assistance: Limited  assistance     Functional Limitations Info  Sight, Hearing, Speech Sight Info: Adequate Hearing Info: Adequate Speech Info: Adequate    SPECIAL CARE FACTORS FREQUENCY  PT (By licensed PT), OT (By licensed OT)     PT Frequency: 3-5 weekkly OT Frequency: 3-5 times weeklky            Contractures Contractures Info: Not present    Additional Factors Info  Code Status Code Status Info: full             Current Medications (04/26/2019):  This is the current hospital active medication list Current Facility-Administered Medications  Medication Dose Route Frequency Provider Last Rate Last Admin  . acetaminophen (TYLENOL) tablet 650 mg  650 mg Oral Q6H PRN Peyton Bottoms, MD      . ALPRAZolam Duanne Moron) tablet 0.25 mg  0.25 mg Oral QHS PRN Peyton Bottoms, MD      . ascorbic acid (VITAMIN C) tablet 500 mg  500 mg Oral Daily Peyton Bottoms, MD   500 mg at 04/26/19 0846  . aspirin EC tablet 81 mg  81 mg Oral Daily Peyton Bottoms, MD   81 mg at 04/26/19 0847  . chlorpheniramine-HYDROcodone (TUSSIONEX) 10-8 MG/5ML suspension 5 mL  5 mL Oral Q12H PRN Peyton Bottoms, MD      . dexamethasone (DECADRON) tablet 6 mg  6 mg Oral Q24H Peyton Bottoms, MD   6 mg at 04/26/19 0857  . docusate sodium (COLACE) capsule 100 mg  100 mg Oral Daily Peyton Bottoms, MD   100 mg at 04/26/19 0847  . enoxaparin (LOVENOX) injection 40  mg  40 mg Subcutaneous Q24H Coletta Memos, MD   40 mg at 04/26/19 0846  . feeding supplement (ENSURE ENLIVE) (ENSURE ENLIVE) liquid 237 mL  237 mL Oral TID BM Maretta Bees, MD   237 mL at 04/25/19 2129  . guaiFENesin-dextromethorphan (ROBITUSSIN DM) 100-10 MG/5ML syrup 10 mL  10 mL Oral Q4H PRN Coletta Memos, MD      . Ipratropium-Albuterol (COMBIVENT) respimat 1 puff  1 puff Inhalation Q6H Coletta Memos, MD   1 puff at 04/26/19 0848  . lisinopril (ZESTRIL) tablet 20 mg  20 mg Oral Daily Coletta Memos, MD   20 mg at 04/25/19 9767  . loperamide (IMODIUM) capsule 2 mg  2 mg Oral PRN  Coletta Memos, MD      . Melatonin TABS 6 mg  6 mg Oral QHS Coletta Memos, MD   6 mg at 04/25/19 2114  . nicotine (NICODERM CQ - dosed in mg/24 hours) patch 21 mg  21 mg Transdermal Daily Coletta Memos, MD   21 mg at 04/26/19 0847  . nitroGLYCERIN (NITROSTAT) SL tablet 0.4 mg  0.4 mg Sublingual Q5 min PRN Coletta Memos, MD      . [facility]) tablet 2.5 mg  2.5 mg Oral QHS Coletta Memos, MD   2.5 mg at 04/25/19 2113  . ondansetron (ZOFRAN) tablet 4 mg  4 mg Oral Q6H PRN Coletta Memos, MD       Or  . ondansetron [facility]) injection 4 mg  4 mg Intravenous Q6H PRN Coletta Memos, MD      . prazosin (MINIPRESS) capsule 5 mg  5 mg Oral QHS Coletta Memos, MD   5 mg at 04/26/19 0045  . remdesivir 100 mg in sodium chloride 0.9 % 100 mL IVPB  100 mg Intravenous Daily Rollene Fare, RPH 200 mL/hr at 04/26/19 0909 100 mg at 04/26/19 0909  . sertraline (ZOLOFT) tablet 100 mg  100 mg Oral Daily Coletta Memos, MD   100 mg at 04/26/19 0848  . simvastatin (ZOCOR) tablet 40 mg  40 mg Oral QHS Coletta Memos, MD   40 mg at 04/25/19 2112  . traMADol (ULTRAM) tablet 50 mg  50 mg Oral Q12H PRN Coletta Memos, MD      . traZODone (DESYREL) tablet 50 mg  50 mg Oral QHS Coletta Memos, MD   50 mg at 04/25/19 2108  . umeclidinium bromide (INCRUSE ELLIPTA) 62.5 MCG/INH 1 puff  1 puff Inhalation Daily Coletta Memos, MD   1 puff at 04/26/19 0847  . zinc sulfate capsule 220 mg  220 mg Oral Daily Coletta Memos, MD   220 mg at 04/26/19 3419     Discharge Medications: Please see discharge summary for a list of discharge medications.  Relevant Imaging Results:  Relevant Lab Results:   Additional Information ssn:[id number]  Golda Acre, RN

## 2019-04-26 NOTE — Progress Notes (Signed)
Occupational Therapy Evaluation Patient Details Name: William DZIKOWSKI MRN: [id number] DOB: Aug 11, 1940 Today's Date: 04/26/2019    History of Present Illness 79 yo male admitted for Covid. PMH includes COPD, coronary disease, HTN, MI   Clinical Impression   PTA pt lived at an ALF, independent in ADLs and mobility. Pt ambulates with a 4WW and reports 0 falls in the last 6 months. Pt does not use oxygen at home and is currently on 4L [place]. Pt tolerated sitting edge of bed 10 min requiring max assist for balance. Noted continual retro and left lateral lean/loss of balance throughout. Pt engaged in simple self-feeding and grooming tasks while seated edge of bed. Pt's activity tolerance limited due to fatigue and dizziness. See BP's listed below. SpO2 decreased to low 80s with activity with pt able to increase following ~2 min pursed lip breathing. Pt reported min shortness of breath throughout. Pt demonstrates decreased strength, endurance, balance, sitting/standing balance, and activity tolerance impacting ability to complete self-care and functional transfer tasks. Recommend skilled OT services to address above deficits in order to promote function and prevent further decline. Recommend SNF placement for additional rehab prior to discharge home.   Initial sitting BP: 84/28mmHg.  8 min sitting EOB: 98/47mmHg.      Follow Up Recommendations  SNF    Equipment Recommendations  Other (comment)(TBD at next venue of care)    Recommendations for Other Services       Precautions / Restrictions Precautions Precautions: Fall;Other (comment) Precaution Comments: Monitor BP Restrictions Weight Bearing Restrictions: No      Mobility Bed Mobility Overal bed mobility: Needs Assistance Bed Mobility: Supine to Sit;Sit to Supine;Rolling Rolling: Supervision(use of bedrails, HOB flat)   Supine to sit: Mod assist Sit to supine: Mod assist;Max assist   General bed mobility comments: Able to reach for  bedrail. Assist with BLEs and trunk  Transfers                 General transfer comment: Not attempted this date due to safety concers. Pt requiring max assist to maintain seated balance.     Balance Overall balance assessment: Needs assistance Sitting-balance support: Feet unsupported;Bilateral upper extremity supported(Difficulty reaching floor with feet) Sitting balance-Leahy Scale: Zero                                     ADL either performed or assessed with clinical judgement   ADL Overall ADL's : Needs assistance/impaired Eating/Feeding: Supervision/ safety;Set up;Bed level   Grooming: Supervision/safety;Set up;Sitting   Upper Body Bathing: Minimal assistance;Bed level   Lower Body Bathing: Maximal assistance;Bed level   Upper Body Dressing : Minimal assistance;Bed level   Lower Body Dressing: Total assistance;Bed level   Toilet Transfer: Total assistance(bed level)   Toileting- Clothing Manipulation and Hygiene: Maximal assistance;Bed level       Functional mobility during ADLs: (not attempted this date) General ADL Comments: Pt able to sit EOB 10 min with max assist for balance. Standing tasks not attempted this date due to weakness, fatigue, and low BP.     Vision Baseline Vision/History: Wears glasses Wears Glasses: At all times       Perception     Praxis      Pertinent Vitals/Pain Pain Assessment: No/denies pain     Hand Dominance Left   Extremity/Trunk Assessment Upper Extremity Assessment Upper Extremity Assessment: Generalized weakness   Lower Extremity Assessment Lower Extremity  Assessment: Defer to PT evaluation       Communication Communication Communication: Other (comment)(Weak voice)   Cognition Arousal/Alertness: Awake/alert Behavior During Therapy: WFL for tasks assessed/performed Overall Cognitive Status: No family/caregiver present to determine baseline cognitive functioning                                  General Comments: Pt A&O x 4. Answering questions appropriately.   General Comments  Pt on 4L [place] with SpO2 92% at rest. SpO2 decreased to low 80s with activity. Pt able to increase oxygen with pursed lip breathing with SpO2 93% at end of session.    Exercises Exercises: Other exercises Other Exercises Other Exercises: Incentive spirometer x 10 with min cues on technique. Pulling . Other Exercises: Flutter valve x 10 with min cues on technique.   Shoulder Instructions      Home Living Family/patient expects to be discharged to:: Assisted living                             Home Equipment: Walker - 4 wheels          Prior Functioning/Environment Level of Independence: Independent        Comments: Pt uses a 4WW for mobility and reports 0 falls in the last 6 months. Pt independent with ADLs, transfers, and mobility tasks. Pt does not use oxygen at home.        OT Problem List: Decreased strength;Decreased activity tolerance;Impaired balance (sitting and/or standing);Cardiopulmonary status limiting activity      OT Treatment/Interventions: Self-care/ADL training;Therapeutic exercise;Neuromuscular education;Energy conservation;DME and/or AE instruction;Therapeutic activities;Patient/family education;Balance training    OT Goals(Current goals can be found in the care plan section) Acute Rehab OT Goals Patient Stated Goal: To go back home Time For Goal Achievement: 05/10/19 Potential to Achieve Goals: Good ADL Goals Pt Will Perform Eating: with set-up;sitting Pt Will Perform Grooming: with set-up;sitting Pt Will Perform Upper Body Bathing: with set-up;sitting Pt Will Transfer to Toilet: with min assist;bedside commode Pt Will Perform Toileting - Clothing Manipulation and hygiene: with min assist;sit to/from stand Additional ADL Goal #1: Pt to recall and demonstrate breathing exercises with 0 verbal cues. Additional ADL Goal #2: Pt to  tolerate sitting EOB 10 min with supervision, in preparation for ADLs. Additional ADL Goal #3: Pt to tolerate standing 3 min with min assist, in preparation for ADLs.  OT Frequency: Min 2X/week   Barriers to D/C:            Co-evaluation              AM-PAC OT "6 Clicks" Daily Activity     Outcome Measure Help from another person eating meals?: A Little Help from another person taking care of personal grooming?: A Little Help from another person toileting, which includes using toliet, bedpan, or urinal?: Total Help from another person bathing (including washing, rinsing, drying)?: A Lot Help from another person to put on and taking off regular upper body clothing?: A Little Help from another person to put on and taking off regular lower body clothing?: Total 6 Click Score: 13   End of Session Equipment Utilized During Treatment: Oxygen Nurse Communication: Mobility status  Activity Tolerance: Patient limited by fatigue(Limited by SOB and dizziness) Patient left: in bed;with call bell/phone within reach;with bed alarm set  OT Visit Diagnosis: Unsteadiness on feet (R26.81);Muscle weakness (generalized) (M62.81)  Time: 1340-1417 OT Time Calculation (min): 37 min Charges:  OT General Charges $OT Visit: 1 Visit OT Evaluation $OT Eval Moderate Complexity: 1 Mod OT Treatments $Therapeutic Activity: 8-22 mins  Peterson Ao OTR/L [phone]   Peterson Ao 04/26/2019, 2:36 PM

## 2019-04-27 LAB — COMPREHENSIVE METABOLIC PANEL
ALT: 13 U/L (ref 0–44)
AST: 23 U/L (ref 15–41)
Albumin: 3.1 g/dL — ABNORMAL LOW (ref 3.5–5.0)
Alkaline Phosphatase: 80 U/L (ref 38–126)
Anion gap: 13 (ref 5–15)
BUN: 92 mg/dL — ABNORMAL HIGH (ref 8–23)
CO2: 21 mmol/L — ABNORMAL LOW (ref 22–32)
Calcium: 8.8 mg/dL — ABNORMAL LOW (ref 8.9–10.3)
Chloride: 107 mmol/L (ref 98–111)
Creatinine, Ser: 2.19 mg/dL — ABNORMAL HIGH (ref 0.61–1.24)
GFR calc Af Amer: 32 mL/min — ABNORMAL LOW (ref >60)
GFR calc non Af Amer: 28 mL/min — ABNORMAL LOW (ref >60)
Glucose, Bld: 150 mg/dL — ABNORMAL HIGH (ref 70–99)
Potassium: 4.3 mmol/L (ref 3.5–5.1)
Sodium: 141 mmol/L (ref 135–145)
Total Bilirubin: 0.9 mg/dL (ref 0.3–1.2)
Total Protein: 6.3 g/dL — ABNORMAL LOW (ref 6.5–8.1)

## 2019-04-27 LAB — CBC WITH DIFFERENTIAL/PLATELET
Abs Immature Granulocytes: 0.05 10*3/uL (ref 0.00–0.07)
Basophils Absolute: 0 10*3/uL (ref 0.0–0.1)
Basophils Relative: 0 %
Eosinophils Absolute: 0 10*3/uL (ref 0.0–0.5)
Eosinophils Relative: 0 %
HCT: 31.1 % — ABNORMAL LOW (ref 39.0–52.0)
Hemoglobin: 10.3 g/dL — ABNORMAL LOW (ref 13.0–17.0)
Immature Granulocytes: 1 %
Lymphocytes Relative: 29 %
Lymphs Abs: 2.4 10*3/uL (ref 0.7–4.0)
MCH: 29.9 pg (ref 26.0–34.0)
MCHC: 33.1 g/dL (ref 30.0–36.0)
MCV: 90.1 fL (ref 80.0–100.0)
Monocytes Absolute: 0.4 10*3/uL (ref 0.1–1.0)
Monocytes Relative: 5 %
Neutro Abs: 5.2 10*3/uL (ref 1.7–7.7)
Neutrophils Relative %: 65 %
Platelets: 181 10*3/uL (ref 150–400)
RBC: 3.45 MIL/uL — ABNORMAL LOW (ref 4.22–5.81)
RDW: 15.5 % (ref 11.5–15.5)
WBC: 8 10*3/uL (ref 4.0–10.5)
nRBC: 0 % (ref 0.0–0.2)

## 2019-04-27 LAB — GLUCOSE, CAPILLARY: Glucose-Capillary: 166 mg/dL — ABNORMAL HIGH (ref 70–99)

## 2019-04-27 LAB — C-REACTIVE PROTEIN: CRP: 13.9 mg/dL — ABNORMAL HIGH (ref <1.0)

## 2019-04-27 LAB — D-DIMER, QUANTITATIVE: D-Dimer, Quant: 1.39 ug/mL-FEU — ABNORMAL HIGH (ref 0.00–0.50)

## 2019-04-27 LAB — FERRITIN: Ferritin: 837 ng/mL — ABNORMAL HIGH (ref 24–336)

## 2019-04-27 MED ORDER — SODIUM CHLORIDE 0.9 % IV SOLN: INTRAVENOUS | Status: AC

## 2019-04-27 MED ORDER — PHENOL 1.4 % MT LIQD
1.0000 | OROMUCOSAL | Status: DC | PRN
  Filled 2019-04-27: qty 177

## 2019-04-27 MED ORDER — MIDODRINE HCL 5 MG PO TABS
2.5000 mg | ORAL_TABLET | Freq: Two times a day (BID) | ORAL | Status: DC
  Administered 2019-04-27 – 2019-04-30 (×7): 2.5 mg via ORAL
  Filled 2019-04-27 (×7): qty 1

## 2019-04-27 NOTE — Progress Notes (Addendum)
PROGRESS NOTE                                                                                                                                                                                                             Patient Demographics:    William Wilcox, is a 79 y.o. male, DOB - December 12, 1940, TWS:[id number]  Outpatient Primary MD for the [facility] is [facility]   Admit date - 04/24/2019   LOS - 3  No chief complaint on file.      Brief Narrative: Patient is a 79 y.o. male with PMHx of COPD, CAD, HTN who presented to [facility] on 1/11 with fever, cough, shortness of breath-he initially was on 3 L of oxygen-however post admission-his hypoxemia worsened needing 15 L of HFNC-patient was subsequently transferred to [facility] on 1/12.  See below for further details.   Subjective:    William Wilcox was lying comfortably when I saw him this morning-he was on 4-5 L of oxygen.  He was not in any distress.   Assessment  & Plan :   Acute Hypoxic Resp Failure due to Covid 19 Viral pneumonia: Remains stable-may be slightly improved-on 4-5 L of oxygen.  CRP continues to downtrend.  Continue steroids and remdesivir.    Fever: afebrile  O2 requirements:  SpO2: 90 % O2 Flow Rate (L/min): 5 L/min   COVID-19 Labs: Recent Labs    04/25/19 0125 04/26/19 0103 04/27/19 0038  DDIMER 0.90* 0.67* 1.39*  FERRITIN 1,146* 1,063* 837*  CRP 15.0* 19.9* 13.9*       Component Value Date/Time   BNP 47.3 04/25/2019 1315    Recent Labs  Lab 04/23/19 1708  PROCALCITON <0.10    Lab Results  Component Value Date   SARSCOV2NAA NEGATIVE 02/01/2019     COVID-19 Medications: Steroids: 1/11>> Remdesivir: 1/11>>/15 Actemra: 1/13 x 1  Other medications: Diuretics:Euvolemic -hold Lasix today. Antibiotics:Not needed as no evidence of bacterial infection  Prone/Incentive Spirometry: encouraged patient to lie prone for  3-4 hours at a time for a total of 16 hours a day, and to encourage incentive spirometry use 3-4/hour.  DVT Prophylaxis  :  Lovenox   AKI on CKD stage IIIa: AKI likely hemodynamically mediated-continues to worsen-start gentle hydration.  Blood pressure appears to be soft-therefore will place him on midodrine for a few  days.  Follow.  Chronic diastolic heart failure: Euvolemic-monitor volume status closely.  COPD: Not in exacerbation-continue inhalers  History of CAD s/p CABG: No anginal symptoms-continue aspirin and statin  HTN: BP on the soft side-continue to hold all antihypertensive agents for now.  Depression/anxiety: Appears stable-continue Zyprexa, Zoloft trazodone and Xanax.  Deconditioning/debility: Appears very frail-has significant atrophy of his calf muscles.  Not sure what his ambulatory status is baseline is.  Obtaining PT/OT.  Goals of care: Spoke with Alexis-legal guardian at [place] on 1/15-given his poor overall health situation-and acute illness with Covid-we both agree that patient is best served by DNR status.  I have reconfirmed this with Alexis-and have placed orders in accordingly.  Consults  :  None  Procedures  :  None  ABG:    Component Value Date/Time   HCO3 22.4 04/23/2019 1031   ACIDBASEDEF 1.1 04/23/2019 1031   O2SAT 93.5 04/23/2019 1031    Vent Settings: N/A   Condition - Extremely Guarded  Family Communication  :  Daughter and and then legal guardian at [place] services (Alexis at [phone]) updated updated over the phone-1/15.  Alexis with [place] with DNR status given his poor overall health/functional status-I totally agree with this.   Code Status :  Full Code  Diet :  Diet Order            DIET DYS 3 Room service appropriate? Yes; Fluid consistency: Thin  Diet effective now               Disposition Plan  :  Remain hospitalized-back to SNF when he is ready  for discharge.  Barriers to discharge: Hypoxia requiring O2 supplementation/complete 5 days of IV Remdesivir  Antimicorbials  :    Anti-infectives (From admission, onward)   Start     Dose/Rate Route Frequency Ordered Stop   04/25/19 1000  remdesivir 100 mg in sodium chloride 0.9 % 100 mL IVPB     100 mg 200 mL/hr over 30 Minutes Intravenous Daily 04/24/19 1921 04/27/19 1149      Inpatient Medications  Scheduled Meds: . vitamin C  500 mg Oral Daily  . aspirin EC  81 mg Oral Daily  . docusate sodium  100 mg Oral Daily  . enoxaparin (LOVENOX) injection  40 mg Subcutaneous Q24H  . feeding supplement (ENSURE ENLIVE)  237 mL Oral TID BM  . Ipratropium-Albuterol  1 puff Inhalation Q6H  . Melatonin  6 mg Oral QHS  . methylPREDNISolone (SOLU-MEDROL) injection  40 mg Intravenous Q12H  . nicotine  21 mg Transdermal Daily  . OLANZapine  2.5 mg Oral QHS  . prazosin  5 mg Oral QHS  . sertraline  100 mg Oral Daily  . simvastatin  40 mg Oral QHS  . traZODone  50 mg Oral QHS  . umeclidinium bromide  1 puff Inhalation Daily  . zinc sulfate  220 mg Oral Daily   Continuous Infusions:  PRN Meds:.acetaminophen, ALPRAZolam, chlorpheniramine-HYDROcodone, guaiFENesin-dextromethorphan, loperamide, nitroGLYCERIN, ondansetron **OR** ondansetron (ZOFRAN) IV, phenol, traMADol   Time Spent in minutes 35   See all Orders from today for further details   Oren Binet M.D on 04/27/2019 at 3:03 PM  To page go to [email or web address] - use universal password  [facility] -  Office  [phone]    Objective:   Vitals:   04/26/19 2129 04/27/19 0000 04/27/19 0539 04/27/19 0900  BP: (!) 130/41 (!) 110/45 (!) 113/48   Pulse:  Marland Kitchen)  21 71   Resp:   (!) 21   Temp:  (!) 97.2 F (36.2 C) (!) 97.5 F (36.4 C) (!) 95 F (35 C)  TempSrc:  Oral Oral Oral  SpO2:  96% 90%   Weight:      Height:        Wt Readings from Last 3 Encounters:  04/25/19 57 kg  04/23/19 60.9 kg  02/18/19 60.9 kg      Intake/Output Summary (Last 24 hours) at 04/27/2019 1503 Last data filed at 04/27/2019 0900 Gross per 24 hour  Intake 790 ml  Output 480 ml  Net 310 ml     Physical Exam Gen Exam:Alert awake-not in any distress.  Looks chronically sick appearing. HEENT:atraumatic, normocephalic Chest: B/L clear to auscultation anteriorly CVS:S1S2 regular Abdomen:soft non tender, non distended Extremities:no edema.  Atrophy of his bilateral calf muscles. Neurology: Non focal Skin: no rash   Data Review:    CBC Recent Labs  Lab 04/23/19 1030 04/25/19 0125 04/26/19 0103 04/27/19 0038  WBC 7.9 9.4 9.8 8.0  HGB 11.6* 11.7* 10.8* 10.3*  HCT 34.9* 34.1* 31.6* 31.1*  PLT 141* 144* 166 181  MCV 88.8 89.3 89.3 90.1  MCH 29.5 30.6 30.5 29.9  MCHC 33.2 34.3 34.2 33.1  RDW 15.1 15.3 15.3 15.5  LYMPHSABS 2.5 2.4 2.6 2.4  MONOABS 0.4 0.5 0.3 0.4  EOSABS 0.0 0.0 0.0 0.0  BASOSABS 0.0 0.0 0.0 0.0    Chemistries  Recent Labs  Lab 04/23/19 1030 04/25/19 0125 04/26/19 0103 04/27/19 0038  NA 141 138 138 141  K 4.2 4.6 4.5 4.3  CL 109 106 106 107  CO2 21* 20* 21* 21*  GLUCOSE 122* 107* 138* 150*  BUN 53* 60* 82* 92*  CREATININE 1.60* 1.25* 1.76* 2.19*  CALCIUM 8.9 8.9 8.7* 8.8*  AST 33 35 29 23  ALT 12 12 14 13   ALKPHOS 71 76 72 80  BILITOT 0.8 0.8 0.6 0.9   ------------------------------------------------------------------------------------------------------------------ No results for input(s): CHOL, HDL, LDLCALC, TRIG, CHOLHDL, LDLDIRECT in the last 72 hours.  No results found for: HGBA1C ------------------------------------------------------------------------------------------------------------------ No results for input(s): TSH, T4TOTAL, T3FREE, THYROIDAB in the last 72 hours.  Invalid input(s): FREET3 ------------------------------------------------------------------------------------------------------------------ Recent Labs    04/26/19 0103 04/27/19 0038  FERRITIN 1,063*  837*    Coagulation profile No results for input(s): INR, PROTIME in the last 168 hours.  Recent Labs    04/26/19 0103 04/27/19 0038  DDIMER 0.67* 1.39*    Cardiac Enzymes No results for input(s): CKMB, TROPONINI, MYOGLOBIN in the last 168 hours.  Invalid input(s): CK ------------------------------------------------------------------------------------------------------------------    Component Value Date/Time   BNP 47.3 04/25/2019 1315    Micro Results Recent Results (from the past 240 hour(s))  Blood Culture (routine x 2)     Status: None (Preliminary result)   Collection Time: 04/23/19 10:31 AM   Specimen: BLOOD  Result Value Ref Range Status   Specimen Description BLOOD LEFT ANTECUBITAL  Final   Special Requests   Final    BOTTLES DRAWN AEROBIC AND ANAEROBIC Blood Culture results may not be optimal due to an excessive volume of blood received in culture bottles   Culture   Final    NO GROWTH 4 DAYS Performed at [facility], [address], [place], [place]    Report Status PENDING  Incomplete  Blood Culture (routine x 2)     Status: None (Preliminary result)   Collection Time: 04/23/19 10:31 AM   Specimen: BLOOD  Result Value Ref Range Status   Specimen Description BLOOD BLOOD RIGHT FOREARM  Final   Special Requests   Final    BOTTLES DRAWN AEROBIC AND ANAEROBIC Blood Culture results may not be optimal due to an excessive volume of blood received in culture bottles   Culture   Final    NO GROWTH 4 DAYS Performed at [facility], [address] [place], [place] [address]    Report Status PENDING  Incomplete  Urine culture     Status: Abnormal   Collection Time: 04/23/19  5:08 PM   Specimen: In/Out Cath Urine  Result Value Ref Range Status   Specimen Description   Final    IN/OUT CATH URINE Performed at [facility], [address], [place], [place] [address]    Special Requests   Final    NONE Performed at [facility], [address], [place], [place] [address]    Culture MULTIPLE SPECIES PRESENT, SUGGEST RECOLLECTION (A)  Final   Report Status 04/25/2019 FINAL  Final    Radiology Reports DG Chest Port 1 View  Result Date: 04/23/2019 CLINICAL DATA:  Fever. EXAM: PORTABLE CHEST 1 VIEW COMPARISON:  February 01, 2019. FINDINGS: Stable cardiomediastinal silhouette. Status post coronary bypass graft. No pneumothorax or pleural effusion is noted. Mild bilateral ill-defined airspace opacities are noted concerning for edema or possibly multifocal pneumonia. Bony thorax is unremarkable. IMPRESSION: Mild bilateral ill-defined airspace opacities are noted concerning for edema or possibly multifocal pneumonia. Followup radiographs are recommended until resolution. Electronically Signed   By: Lupita Raider M.D.   On: 04/23/2019 10:25

## 2019-04-27 NOTE — Progress Notes (Signed)
Motivated patient to eat 90% of mash potatoes from dinner tray successfully while FaceTime with daughter Eunice Blase. Patient frequently request beverages such as sprite and ginger ale and successfully intakes. Bed bath offered to patient as well as linen change, patient refuses. Second attempt for bed bath and linen change will be placed in a.m. prior to shift change.

## 2019-04-27 NOTE — Progress Notes (Signed)
Patient nasal canula consistently slips of while sleeping, saturation drops to 79%. Patient quickly recovers once high flow nasal canula reapplied and patient repositioned. Current saturation 94% on 4 liters nasal canula, and lateral left side lying position.

## 2019-04-28 LAB — CBC WITH DIFFERENTIAL/PLATELET
Abs Immature Granulocytes: 0.1 10*3/uL — ABNORMAL HIGH (ref 0.00–0.07)
Basophils Absolute: 0 10*3/uL (ref 0.0–0.1)
Basophils Relative: 0 %
Eosinophils Absolute: 0 10*3/uL (ref 0.0–0.5)
Eosinophils Relative: 0 %
HCT: 31.1 % — ABNORMAL LOW (ref 39.0–52.0)
Hemoglobin: 10.5 g/dL — ABNORMAL LOW (ref 13.0–17.0)
Immature Granulocytes: 1 %
Lymphocytes Relative: 24 %
Lymphs Abs: 2.1 10*3/uL (ref 0.7–4.0)
MCH: 30.4 pg (ref 26.0–34.0)
MCHC: 33.8 g/dL (ref 30.0–36.0)
MCV: 90.1 fL (ref 80.0–100.0)
Monocytes Absolute: 0.4 10*3/uL (ref 0.1–1.0)
Monocytes Relative: 4 %
Neutro Abs: 6.3 10*3/uL (ref 1.7–7.7)
Neutrophils Relative %: 71 %
Platelets: 181 10*3/uL (ref 150–400)
RBC: 3.45 MIL/uL — ABNORMAL LOW (ref 4.22–5.81)
RDW: 15.5 % (ref 11.5–15.5)
WBC: 8.9 10*3/uL (ref 4.0–10.5)
nRBC: 0.2 % (ref 0.0–0.2)

## 2019-04-28 LAB — COMPREHENSIVE METABOLIC PANEL
ALT: 16 U/L (ref 0–44)
AST: 24 U/L (ref 15–41)
Albumin: 2.9 g/dL — ABNORMAL LOW (ref 3.5–5.0)
Alkaline Phosphatase: 83 U/L (ref 38–126)
Anion gap: 11 (ref 5–15)
BUN: 119 mg/dL — ABNORMAL HIGH (ref 8–23)
CO2: 20 mmol/L — ABNORMAL LOW (ref 22–32)
Calcium: 8.4 mg/dL — ABNORMAL LOW (ref 8.9–10.3)
Chloride: 107 mmol/L (ref 98–111)
Creatinine, Ser: 1.96 mg/dL — ABNORMAL HIGH (ref 0.61–1.24)
GFR calc Af Amer: 37 mL/min — ABNORMAL LOW (ref >60)
GFR calc non Af Amer: 32 mL/min — ABNORMAL LOW (ref >60)
Glucose, Bld: 149 mg/dL — ABNORMAL HIGH (ref 70–99)
Potassium: 4.8 mmol/L (ref 3.5–5.1)
Sodium: 138 mmol/L (ref 135–145)
Total Bilirubin: 0.5 mg/dL (ref 0.3–1.2)
Total Protein: 5.9 g/dL — ABNORMAL LOW (ref 6.5–8.1)

## 2019-04-28 LAB — FERRITIN: Ferritin: 439 ng/mL — ABNORMAL HIGH (ref 24–336)

## 2019-04-28 LAB — CULTURE, BLOOD (ROUTINE X 2)
Culture: NO GROWTH
Culture: NO GROWTH

## 2019-04-28 LAB — C-REACTIVE PROTEIN: CRP: 7.5 mg/dL — ABNORMAL HIGH (ref <1.0)

## 2019-04-28 LAB — D-DIMER, QUANTITATIVE: D-Dimer, Quant: 2.51 ug/mL-FEU — ABNORMAL HIGH (ref 0.00–0.50)

## 2019-04-28 MED ORDER — SODIUM CHLORIDE 0.9 % IV SOLN: INTRAVENOUS | Status: AC

## 2019-04-28 NOTE — Progress Notes (Signed)
PROGRESS NOTE                                                                                                                                                                                                             Patient Demographics:    William Wilcox, is a 79 y.o. male, DOB - 09-28-1940, TSV:[id number]  Outpatient Primary MD for the patient is [place] date - 04/24/2019   LOS - 4  No chief complaint on file.      Brief Narrative: Patient is a 79 y.o. male with PMHx of COPD, CAD, HTN who presented to [facility] on 1/11 with fever, cough, shortness of breath-he initially was on 3 L of oxygen-however post admission-his hypoxemia worsened needing 15 L of HFNC-patient was subsequently transferred to [facility] on 1/12.  See below for further details.  COVID-19 Medications: Steroids: 1/11>> Remdesivir: 1/11>>1/15 Actemra: 1/13 x 1   Subjective:    William Wilcox was lying comfortably in bed this morning-he remains on 5-6 L of oxygen.  Per nursing staff-no major issues overnight-however his appetite is extremely poor.  William Wilcox denied any complaints to this MD-he told me he is not interested in eating breakfast.   Assessment  & Plan :   Acute Hypoxic Resp Failure due to Covid 19 Viral pneumonia: Still requiring approximately 5-6 L of oxygen-continue steroids-completed remdesivir on 1/15.    Fever: afebrile  O2 requirements:  SpO2: 90 % O2 Flow Rate (L/min): 6 L/min   COVID-19 Labs: Recent Labs    04/26/19 0103 04/27/19 0038 04/28/19 0150  DDIMER 0.67* 1.39* 2.51*  FERRITIN 1,063* 837* 439*  CRP 19.9* 13.9* 7.5*       Component Value Date/Time   BNP 47.3 04/25/2019 1315    Recent Labs  Lab 04/23/19 1708  PROCALCITON <0.10    Lab Results  Component Value Date   SARSCOV2NAA NEGATIVE 02/01/2019     Prone/Incentive Spirometry: encouraged patient to lie prone for 3-4  hours at a time for a total of 16 hours a day, and to encourage incentive spirometry use 3-4/hour.  DVT Prophylaxis  :  Lovenox   AKI on CKD stage IIIa: AKI likely hemodynamically mediated-secondary to poor oral intake-soft blood pressure-some improvement after starting IV fluids and low-dose midodrine.  Continue gentle hydration for another 24  hours.  Chronic diastolic heart failure: Euvolemic-monitor volume status closely.  COPD: Not in exacerbation-continue inhalers  History of CAD s/p CABG: No anginal symptoms-continue aspirin and statin  HTN: BP on the soft side-continue to hold all antihypertensive agents for now.  Depression/anxiety: Appears stable-continue Zyprexa, Zoloft trazodone and Xanax.  Deconditioning/debility: Appears very frail-has significant atrophy of his calf muscles.  Not sure what his ambulatory status is baseline is.  Obtaining PT/OT.  Failure to thrive syndrome: Appears to be very frail at baseline-with more weakness-and very poor appetite.  Have asked RN to see if we can help him with feeding-continue mobilization with PT/RN-out of bed to chair etc.  Goals of care: Spoke with Alexis-legal guardian at [facility] social services on 1/15-given his poor overall health situation-and acute illness with Covid-we both agree that patient is best served by DNR status.  I have reconfirmed this with Alexis-and have placed orders in accordingly.  Consults  :  None  Procedures  :  None  ABG:    Component Value Date/Time   HCO3 22.4 04/23/2019 1031   ACIDBASEDEF 1.1 04/23/2019 1031   O2SAT 93.5 04/23/2019 1031    Vent Settings: N/A   Condition - Extremely Guarded  Family Communication: Although has a legal guardian-I have updated this patient's daughter-Debbie daily over the past few days.  Updated her today on 1/16-she is aware that given patient's overall frailty/poor health-he is not a candidate for aggressive care-and that if he were to deteriorate-he would  be hospice appropriate.  Legal egal guardian at [facility] social services Jon Gills at [phone]) updated updated over the phone-1/15.   If over the weekend-any emergency issues arise-call [facility] on call at [phone].  The main number at [facility] social services [phone]   Code Status :  Full Code  Diet :  Diet Order            DIET DYS 3 Room service appropriate? Yes; Fluid consistency: Thin  Diet effective now               Disposition Plan  :  Remain hospitalized-back to SNF when he is ready for discharge.  Barriers to discharge: Hypoxia requiring O2 supplementation Antimicorbials  :    Anti-infectives (From admission, onward)   Start     Dose/Rate Route Frequency Ordered Stop   04/25/19 1000  remdesivir 100 mg in sodium chloride 0.9 % 100 mL IVPB     100 mg 200 mL/hr over 30 Minutes Intravenous Daily 04/24/19 1921 04/27/19 1149      Inpatient Medications  Scheduled Meds: . vitamin C  500 mg Oral Daily  . aspirin EC  81 mg Oral Daily  . docusate sodium  100 mg Oral Daily  . enoxaparin (LOVENOX) injection  40 mg Subcutaneous Q24H  . feeding supplement (ENSURE ENLIVE)  237 mL Oral TID BM  . Ipratropium-Albuterol  1 puff Inhalation Q6H  . Melatonin  6 mg Oral QHS  . methylPREDNISolone (SOLU-MEDROL) injection  40 mg Intravenous Q12H  . midodrine  2.5 mg Oral BID WC  . nicotine  21 mg Transdermal Daily  . OLANZapine  2.5 mg Oral QHS  . prazosin  5 mg Oral QHS  . sertraline  100 mg Oral Daily  . simvastatin  40 mg Oral QHS  . traZODone  50 mg Oral QHS  . umeclidinium bromide  1 puff Inhalation Daily  . zinc sulfate  220 mg Oral Daily   Continuous Infusions:  PRN  Meds:.acetaminophen, ALPRAZolam, chlorpheniramine-HYDROcodone, guaiFENesin-dextromethorphan, loperamide, nitroGLYCERIN, ondansetron **OR** ondansetron (ZOFRAN) IV, phenol, traMADol   Time Spent in minutes 35   See all Orders from today for further  details   Jeoffrey Massed M.D on 04/28/2019 at 1:34 PM  To page go to [email or web address] - use universal password  [facility] -  Office  [phone]    Objective:   Vitals:   04/28/19 0000 04/28/19 0400 04/28/19 0721 04/28/19 1212  BP: (!) 120/48 (!) 111/42 (!) 129/42 (!) 140/48  Pulse: 70 (!) 56 69 73  Resp:  (!) 21 20 17   Temp: (!) 96.2 F (35.7 C) (!) 96.5 F (35.8 C) (!) 97.5 F (36.4 C) (!) 97.4 F (36.3 C)  TempSrc: Oral Axillary Oral Oral  SpO2: 91% 93% 92% 90%  Weight:      Height:        Wt Readings from Last 3 Encounters:  04/25/19 57 kg  04/23/19 60.9 kg  02/18/19 60.9 kg     Intake/Output Summary (Last 24 hours) at 04/28/2019 1334 Last data filed at 04/28/2019 1053 Gross per 24 hour  Intake 760.4 ml  Output 975 ml  Net -214.6 ml     Physical Exam Gen Exam:Alert awake-not in any distress.  Looks chronically sick appearing. HEENT:atraumatic, normocephalic Chest: B/L clear to auscultation anteriorly CVS:S1S2 regular Abdomen:soft non tender, non distended Extremities:no edema-significant atrophy of bilateral calf muscles. Neurology: Non focal Skin: no rash   Data Review:    CBC Recent Labs  Lab 04/23/19 1030 04/25/19 0125 04/26/19 0103 04/27/19 0038 04/28/19 0150  WBC 7.9 9.4 9.8 8.0 8.9  HGB 11.6* 11.7* 10.8* 10.3* 10.5*  HCT 34.9* 34.1* 31.6* 31.1* 31.1*  PLT 141* 144* 166 181 181  MCV 88.8 89.3 89.3 90.1 90.1  MCH 29.5 30.6 30.5 29.9 30.4  MCHC 33.2 34.3 34.2 33.1 33.8  RDW 15.1 15.3 15.3 15.5 15.5  LYMPHSABS 2.5 2.4 2.6 2.4 2.1  MONOABS 0.4 0.5 0.3 0.4 0.4  EOSABS 0.0 0.0 0.0 0.0 0.0  BASOSABS 0.0 0.0 0.0 0.0 0.0    Chemistries  Recent Labs  Lab 04/23/19 1030 04/25/19 0125 04/26/19 0103 04/27/19 0038 04/28/19 0150  NA 141 138 138 141 138  K 4.2 4.6 4.5 4.3 4.8  CL 109 106 106 107 107  CO2 21* 20* 21* 21* 20*  GLUCOSE 122* 107* 138* 150* 149*  BUN 53* 60* 82* 92* 119*  CREATININE 1.60* 1.25* 1.76* 2.19* 1.96*   CALCIUM 8.9 8.9 8.7* 8.8* 8.4*  AST 33 35 29 23 24   ALT 12 12 14 13 16   ALKPHOS 71 76 72 80 83  BILITOT 0.8 0.8 0.6 0.9 0.5   ------------------------------------------------------------------------------------------------------------------ No results for input(s): CHOL, HDL, LDLCALC, TRIG, CHOLHDL, LDLDIRECT in the last 72 hours.  No results found for: HGBA1C ------------------------------------------------------------------------------------------------------------------ No results for input(s): TSH, T4TOTAL, T3FREE, THYROIDAB in the last 72 hours.  Invalid input(s): FREET3 ------------------------------------------------------------------------------------------------------------------ Recent Labs    04/27/19 0038 04/28/19 0150  FERRITIN 837* 439*    Coagulation profile No results for input(s): INR, PROTIME in the last 168 hours.  Recent Labs    04/27/19 0038 04/28/19 0150  DDIMER 1.39* 2.51*    Cardiac Enzymes No results for input(s): CKMB, TROPONINI, MYOGLOBIN in the last 168 hours.  Invalid input(s): CK ------------------------------------------------------------------------------------------------------------------    Component Value Date/Time   BNP 47.3 04/25/2019 1315    Micro Results Recent Results (from the past 240 hour(s))  Blood Culture (routine x 2)     Status: None  Collection Time: 04/23/19 10:31 AM   Specimen: BLOOD  Result Value Ref Range Status   Specimen Description BLOOD LEFT ANTECUBITAL  Final   Special Requests   Final    BOTTLES DRAWN AEROBIC AND ANAEROBIC Blood Culture results may not be optimal due to an excessive volume of blood received in culture bottles   Culture   Final    NO GROWTH 5 DAYS Performed at [facility], [address] [place], [place] [address]    Report Status 04/28/2019 FINAL  Final  Blood Culture (routine x 2)     Status: None   Collection Time: 04/23/19 10:31 AM   Specimen: BLOOD  Result Value  Ref Range Status   Specimen Description BLOOD BLOOD RIGHT FOREARM  Final   Special Requests   Final    BOTTLES DRAWN AEROBIC AND ANAEROBIC Blood Culture results may not be optimal due to an excessive volume of blood received in culture bottles   Culture   Final    NO GROWTH 5 DAYS Performed at [facility], [address], [place], [place] [address]    Report Status 04/28/2019 FINAL  Final  Urine culture     Status: Abnormal   Collection Time: 04/23/19  5:08 PM   Specimen: In/Out Cath Urine  Result Value Ref Range Status   Specimen Description   Final    IN/OUT CATH URINE Performed at [facility], [address] [place], [place] [address]    Special Requests   Final    NONE Performed at [facility], [address] [place], [place] [address]    Culture MULTIPLE SPECIES PRESENT, SUGGEST RECOLLECTION (A)  Final   Report Status 04/25/2019 FINAL  Final    Radiology Reports DG Chest Port 1 View  Result Date: 04/23/2019 CLINICAL DATA:  Fever. EXAM: PORTABLE CHEST 1 VIEW COMPARISON:  February 01, 2019. FINDINGS: Stable cardiomediastinal silhouette. Status post coronary bypass graft. No pneumothorax or pleural effusion is noted. Mild bilateral ill-defined airspace opacities are noted concerning for edema or possibly multifocal pneumonia. Bony thorax is unremarkable. IMPRESSION: Mild bilateral ill-defined airspace opacities are noted concerning for edema or possibly multifocal pneumonia. Followup radiographs are recommended until resolution. Electronically Signed   By: Lupita Raider M.D.   On: 04/23/2019 10:25

## 2019-04-28 NOTE — Progress Notes (Signed)
Pt A&Ox2, doesn't know the year when asked, cont to have generalized pain 2/10 refuses prn pain medication, very poor po intake, drinking fluids through-out the shift, IVF infusing @ 84ml/hr. Currently on 9L HFNC, titrating up and down depending on pt's  Saturation. Pt able to speak with daughter Almira Coaster for a short time, good urinary output noted this shift, no s/c required. Pt co-op but very irritable about being in the hospital, wants to go home. Will cont to monitor.

## 2019-04-29 LAB — COMPREHENSIVE METABOLIC PANEL
ALT: 20 U/L (ref 0–44)
AST: 30 U/L (ref 15–41)
Albumin: 2.9 g/dL — ABNORMAL LOW (ref 3.5–5.0)
Alkaline Phosphatase: 96 U/L (ref 38–126)
Anion gap: 9 (ref 5–15)
BUN: 94 mg/dL — ABNORMAL HIGH (ref 8–23)
CO2: 21 mmol/L — ABNORMAL LOW (ref 22–32)
Calcium: 9 mg/dL (ref 8.9–10.3)
Chloride: 114 mmol/L — ABNORMAL HIGH (ref 98–111)
Creatinine, Ser: 1.3 mg/dL — ABNORMAL HIGH (ref 0.61–1.24)
GFR calc Af Amer: >60 mL/min (ref >60)
GFR calc non Af Amer: 52 mL/min — ABNORMAL LOW (ref >60)
Glucose, Bld: 117 mg/dL — ABNORMAL HIGH (ref 70–99)
Potassium: 5.5 mmol/L — ABNORMAL HIGH (ref 3.5–5.1)
Sodium: 144 mmol/L (ref 135–145)
Total Bilirubin: 0.6 mg/dL (ref 0.3–1.2)
Total Protein: 5.8 g/dL — ABNORMAL LOW (ref 6.5–8.1)

## 2019-04-29 LAB — CBC WITH DIFFERENTIAL/PLATELET
Abs Immature Granulocytes: 0.15 10*3/uL — ABNORMAL HIGH (ref 0.00–0.07)
Basophils Absolute: 0 10*3/uL (ref 0.0–0.1)
Basophils Relative: 0 %
Eosinophils Absolute: 0 10*3/uL (ref 0.0–0.5)
Eosinophils Relative: 0 %
HCT: 32 % — ABNORMAL LOW (ref 39.0–52.0)
Hemoglobin: 10.5 g/dL — ABNORMAL LOW (ref 13.0–17.0)
Immature Granulocytes: 2 %
Lymphocytes Relative: 22 %
Lymphs Abs: 2.3 10*3/uL (ref 0.7–4.0)
MCH: 29.7 pg (ref 26.0–34.0)
MCHC: 32.8 g/dL (ref 30.0–36.0)
MCV: 90.7 fL (ref 80.0–100.0)
Monocytes Absolute: 0.3 10*3/uL (ref 0.1–1.0)
Monocytes Relative: 3 %
Neutro Abs: 7.5 10*3/uL (ref 1.7–7.7)
Neutrophils Relative %: 73 %
Platelets: 186 10*3/uL (ref 150–400)
RBC: 3.53 MIL/uL — ABNORMAL LOW (ref 4.22–5.81)
RDW: 15.4 % (ref 11.5–15.5)
WBC: 10.3 10*3/uL (ref 4.0–10.5)
nRBC: 0 % (ref 0.0–0.2)

## 2019-04-29 LAB — FERRITIN: Ferritin: 425 ng/mL — ABNORMAL HIGH (ref 24–336)

## 2019-04-29 LAB — C-REACTIVE PROTEIN: CRP: 3.4 mg/dL — ABNORMAL HIGH (ref <1.0)

## 2019-04-29 LAB — D-DIMER, QUANTITATIVE: D-Dimer, Quant: 2.49 ug/mL-FEU — ABNORMAL HIGH (ref 0.00–0.50)

## 2019-04-29 MED ORDER — METHYLPREDNISOLONE SODIUM SUCC 40 MG IJ SOLR
40.0000 mg | Freq: Every day | INTRAMUSCULAR | Status: DC
  Administered 2019-04-30 – 2019-05-01 (×2): 40 mg via INTRAVENOUS
  Filled 2019-04-29 (×2): qty 1

## 2019-04-29 MED ORDER — SODIUM POLYSTYRENE SULFONATE 15 GM/60ML PO SUSP
30.0000 g | Freq: Once | ORAL | Status: AC
  Administered 2019-04-29: 30 g via ORAL
  Filled 2019-04-29: qty 120

## 2019-04-29 NOTE — Progress Notes (Signed)
PROGRESS NOTE                                                                                                                                                                                                             Patient Demographics:    William Wilcox, is a 79 y.o. male, DOB - 05/21/1940, PFX:[id number]  Outpatient Primary MD for the [facility] is [facility]   Admit date - 04/24/2019   LOS - 5  No chief complaint on file.      Brief Narrative: Patient is a 79 y.o. male with PMHx of COPD, CAD, HTN who presented to [facility] on 1/11 with fever, cough, shortness of breath-he initially was on 3 L of oxygen-however post admission-his hypoxemia worsened needing 15 L of HFNC-patient was subsequently transferred to [facility] on 1/12.  See below for further details.  COVID-19 Medications: Steroids: 1/11>> Remdesivir: 1/11>>1/15 Actemra: 1/13 x 1   Subjective:   Patient in bed, appears comfortable, denies any headache, no fever, no chest pain or pressure, improved shortness of breath , no abdominal pain. No focal weakness.   Assessment  & Plan :   Acute Hypoxic Resp Failure due to Covid 19 Viral pneumonia: He had severe disease and was treated with IV steroids, remdesivir and Actemra.  Gradual improvement.  Still requiring 8 to 10 L nasal cannula oxygen but marginally better than before, continue to taper down oxygen and steroids as tolerated, advance activity.  Encouraged to sit up in chair and daytime use I-S and flutter valve for pulmonary toiletry and prone in bed at night.  O2 requirements:  SpO2: (!) 86 % O2 Flow Rate (L/min): 8 L/min   COVID-19 Labs: Recent Labs    04/27/19 0038 04/28/19 0150 04/29/19 0135  DDIMER 1.39* 2.51* 2.49*  FERRITIN 837* 439* 425*  CRP 13.9* 7.5* 3.4*       Component Value Date/Time   BNP 47.3 04/25/2019 1315    Recent Labs  Lab 04/23/19 1708    PROCALCITON <0.10    Lab Results  Component Value Date   SARSCOV2NAA NEGATIVE 02/01/2019       AKI on CKD stage IIIa: AKI due to dehydration and hypotension improved after IV fluids and midodrine which will be continued.  Continue to monitor.  Chronic diastolic heart failure: Euvolemic-monitor volume status closely.  COPD: Not in exacerbation-continue inhalers  History of CAD s/p CABG: No anginal symptoms-continue aspirin and statin  HTN: BP on the soft side-continue to hold all antihypertensive agents for now.  Depression/anxiety: Appears stable-continue Zyprexa, Zoloft trazodone and Xanax.  Deconditioning/debility: Appears very frail-has significant atrophy of his calf muscles.  Not sure what his ambulatory status is baseline is.  Obtaining PT/OT.  Failure to thrive syndrome: Appears to be very frail at baseline-with more weakness-and very poor appetite.  Have asked RN to see if we can help him with feeding-continue mobilization with PT/RN-out of bed to chair etc.  Goals of care: Spoke with Alexis-legal guardian at [facility] on 1/15-given his poor overall health situation-and acute illness with Covid-we both agree that patient is best served by DNR status.  I have reconfirmed this with Alexis-and have placed orders in accordingly.  Consults  :  None  Procedures  :  None  Condition - Extremely Guarded  Family Communication:  By previous MD - Although has a legal guardian-I have updated this patient's daughter-Debbie daily over the past few days.  Updated her today on 1/16-she is aware that given patient's overall frailty/poor health-he is not a candidate for aggressive care-and that if he were to deteriorate-he would be hospice appropriate.  Legal egal guardian at [place] [employer] at [phone]) updated updated over the phone-1/15.   If over the weekend-any emergency issues arise-call [facility] on call at  [phone].  The main number at [facility] [phone]   Code Status :  Full Code  Diet :  Diet Order            DIET DYS 3 Room service appropriate? Yes; Fluid consistency: Thin  Diet effective now               Disposition Plan  :  Remain hospitalized-back to SNF when he is ready for discharge.  Barriers to discharge: Hypoxia requiring O2 supplementation  Antimicorbials  :    Anti-infectives (From admission, onward)   Start     Dose/Rate Route Frequency Ordered Stop   04/25/19 1000  remdesivir 100 mg in sodium chloride 0.9 % 100 mL IVPB     100 mg 200 mL/hr over 30 Minutes Intravenous Daily 04/24/19 1921 04/27/19 1149      DVT Prophylaxis  :  Lovenox   Inpatient Medications  Scheduled Meds: . vitamin C  500 mg Oral Daily  . aspirin EC  81 mg Oral Daily  . docusate sodium  100 mg Oral Daily  . enoxaparin (LOVENOX) injection  40 mg Subcutaneous Q24H  . feeding supplement (ENSURE ENLIVE)  237 mL Oral TID BM  . Ipratropium-Albuterol  1 puff Inhalation Q6H  . Melatonin  6 mg Oral QHS  . [START ON 04/30/2019] methylPREDNISolone (SOLU-MEDROL) injection  40 mg Intravenous Daily  . midodrine  2.5 mg Oral BID WC  . nicotine  21 mg Transdermal Daily  . OLANZapine  2.5 mg Oral QHS  . prazosin  5 mg Oral QHS  . sertraline  100 mg Oral Daily  . simvastatin  40 mg Oral QHS  . traZODone  50 mg Oral QHS  . umeclidinium bromide  1 puff Inhalation Daily  . zinc sulfate  220 mg Oral Daily   Continuous Infusions: . sodium chloride 50 mL/hr at 04/28/19 1543   PRN Meds:.acetaminophen, ALPRAZolam, chlorpheniramine-HYDROcodone, guaiFENesin-dextromethorphan, loperamide, nitroGLYCERIN, [DISCONTINUED] ondansetron **OR** ondansetron (ZOFRAN) IV, phenol, traMADol   Time Spent  in minutes 35   See all Orders from today for further details   Susa Raring M.D on 04/29/2019 at 11:35 AM  To page go to [email or web address] - use universal password  [facility] -   Office  [phone]    Objective:   Vitals:   04/28/19 2000 04/29/19 0000 04/29/19 0500 04/29/19 0845  BP: (!) 164/47 (!) 115/47 (!) 116/50 (!) 144/44  Pulse: 65 75 (!) 57 83  Resp: 18 16 16 20   Temp: (!) 97.4 F (36.3 C) (!) 97.4 F (36.3 C) (!) 97.5 F (36.4 C) 97.6 F (36.4 C)  TempSrc: Oral Oral Oral Oral  SpO2: 94% 90% 99% (!) 86%  Weight:      Height:        Wt Readings from Last 3 Encounters:  04/25/19 57 kg  04/23/19 60.9 kg  02/18/19 60.9 kg     Intake/Output Summary (Last 24 hours) at 04/29/2019 1135 Last data filed at 04/29/2019 0500 Gross per 24 hour  Intake 1243.75 ml  Output 1175 ml  Net 68.75 ml     Physical Exam  Awake , mildly confused, but in no distress, No new F.N deficits  [place].AT,PERRAL Supple Neck,No JVD, No cervical lymphadenopathy appriciated.  Symmetrical Chest wall movement, Good air movement bilaterally, CTAB RRR,No Gallops, Rubs or new Murmurs, No Parasternal Heave +ve B.Sounds, Abd Soft, No tenderness, No organomegaly appriciated, No rebound - guarding or rigidity. No Cyanosis, Clubbing or edema, No new Rash or bruise    Data Review:    CBC Recent Labs  Lab 04/25/19 0125 04/26/19 0103 04/27/19 0038 04/28/19 0150 04/29/19 0135  WBC 9.4 9.8 8.0 8.9 10.3  HGB 11.7* 10.8* 10.3* 10.5* 10.5*  HCT 34.1* 31.6* 31.1* 31.1* 32.0*  PLT 144* 166 181 181 186  MCV 89.3 89.3 90.1 90.1 90.7  MCH 30.6 30.5 29.9 30.4 29.7  MCHC 34.3 34.2 33.1 33.8 32.8  RDW 15.3 15.3 15.5 15.5 15.4  LYMPHSABS 2.4 2.6 2.4 2.1 2.3  MONOABS 0.5 0.3 0.4 0.4 0.3  EOSABS 0.0 0.0 0.0 0.0 0.0  BASOSABS 0.0 0.0 0.0 0.0 0.0    Chemistries  Recent Labs  Lab 04/25/19 0125 04/26/19 0103 04/27/19 0038 04/28/19 0150 04/29/19 0135  NA 138 138 141 138 144  K 4.6 4.5 4.3 4.8 5.5*  CL 106 106 107 107 114*  CO2 20* 21* 21* 20* 21*  GLUCOSE 107* 138* 150* 149* 117*  BUN 60* 82* 92* 119* 94*  CREATININE 1.25* 1.76* 2.19* 1.96* 1.30*  CALCIUM 8.9 8.7* 8.8*  8.4* 9.0  AST 35 29 23 24 30   ALT 12 14 13 16 20   ALKPHOS 76 72 80 83 96  BILITOT 0.8 0.6 0.9 0.5 0.6   ------------------------------------------------------------------------------------------------------------------ No results for input(s): CHOL, HDL, LDLCALC, TRIG, CHOLHDL, LDLDIRECT in the last 72 hours.  No results found for: HGBA1C ------------------------------------------------------------------------------------------------------------------ No results for input(s): TSH, T4TOTAL, T3FREE, THYROIDAB in the last 72 hours.  Invalid input(s): FREET3 ------------------------------------------------------------------------------------------------------------------ Recent Labs    04/28/19 0150 04/29/19 0135  FERRITIN 439* 425*    Coagulation profile No results for input(s): INR, PROTIME in the last 168 hours.  Recent Labs    04/28/19 0150 04/29/19 0135  DDIMER 2.51* 2.49*    Cardiac Enzymes No results for input(s): CKMB, TROPONINI, MYOGLOBIN in the last 168 hours.  Invalid input(s): CK ------------------------------------------------------------------------------------------------------------------    Component Value Date/Time   BNP 47.3 04/25/2019 1315    Micro Results Recent Results (from the past 240 hour(s))  Blood  Culture (routine x 2)     Status: None   Collection Time: 04/23/19 10:31 AM   Specimen: BLOOD  Result Value Ref Range Status   Specimen Description BLOOD LEFT ANTECUBITAL  Final   Special Requests   Final    BOTTLES DRAWN AEROBIC AND ANAEROBIC Blood Culture results may not be optimal due to an excessive volume of blood received in culture bottles   Culture   Final    NO GROWTH 5 DAYS Performed at [facility], [address] [place], [place] [address]    Report Status 04/28/2019 FINAL  Final  Blood Culture (routine x 2)     Status: None   Collection Time: 04/23/19 10:31 AM   Specimen: BLOOD  Result Value Ref Range Status    Specimen Description BLOOD BLOOD RIGHT FOREARM  Final   Special Requests   Final    BOTTLES DRAWN AEROBIC AND ANAEROBIC Blood Culture results may not be optimal due to an excessive volume of blood received in culture bottles   Culture   Final    NO GROWTH 5 DAYS Performed at [facility], [address] [place], [place] [address]    Report Status 04/28/2019 FINAL  Final  Urine culture     Status: Abnormal   Collection Time: 04/23/19  5:08 PM   Specimen: In/Out Cath Urine  Result Value Ref Range Status   Specimen Description   Final    IN/OUT CATH URINE Performed at [facility], [address], [place], [place] [address]    Special Requests   Final    NONE Performed at [facility], [address] [place], [place] [address]    Culture MULTIPLE SPECIES PRESENT, SUGGEST RECOLLECTION (A)  Final   Report Status 04/25/2019 FINAL  Final    Radiology Reports DG Chest Port 1 View  Result Date: 04/23/2019 CLINICAL DATA:  Fever. EXAM: PORTABLE CHEST 1 VIEW COMPARISON:  February 01, 2019. FINDINGS: Stable cardiomediastinal silhouette. Status post coronary bypass graft. No pneumothorax or pleural effusion is noted. Mild bilateral ill-defined airspace opacities are noted concerning for edema or possibly multifocal pneumonia. Bony thorax is unremarkable. IMPRESSION: Mild bilateral ill-defined airspace opacities are noted concerning for edema or possibly multifocal pneumonia. Followup radiographs are recommended until resolution. Electronically Signed   By: Lupita Raider M.D.   On: 04/23/2019 10:25

## 2019-04-29 NOTE — Evaluation (Signed)
PTPhysical Therapy Evaluation Patient Details Name: William Wilcox MRN: [id number] DOB: May 30, 1940 Today's Date: 04/29/2019   History of Present Illness  79 yo male admitted for Covid. PMH includes COPD, coronary disease, HTN, MI  Clinical Impression  Received new orders on this very frail male patient. He is unable to stand erect, and requires mod A of 2 for all transfers. He was having diarrhea during this session. He complained of being tired. He is on HF [place] 8 LPM. Needs close monitoring for skin is very prone to tears and bruising. He is generally weak, and currently requires 24/7 supervision and assistance with all ADLs. He does need ongoing skilled PT and will be followed 2 x week. Should benefit from PT to promote optimal functional outcomes    Follow Up Recommendations SNF    Equipment Recommendations       Recommendations for Other Services       Precautions / Restrictions Precautions Precautions: Fall;Other (comment) Precaution Comments: Monitor BP Restrictions Weight Bearing Restrictions: No      Mobility  Bed Mobility Overal bed mobility: Needs Assistance Bed Mobility: Supine to Sit;Sit to Supine;Rolling Rolling: Supervision   Supine to sit: Mod assist Sit to supine: Mod assist;Max assist   General bed mobility comments: Able to reach for bedrail. Assist with BLEs and trunk  Transfers Overall transfer level: Needs assistance Equipment used: 2 person hand held assist Transfers: Sit to/from [employer] transfer comment: Assistance of 2 , BS chair <>BS commode. He was having diarrhea  Ambulation/Gait Ambulation/Gait assistance: +2 physical assistance Gait Distance (Feet): 8 Feet(4 to and from BS chair to BS commode)         General Gait Details: Could not stand fully erect, tends to FF trunk  Stairs            Wheelchair Mobility    Modified Rankin (Stroke Patients Only)       Balance Overall balance  assessment: Needs assistance   Sitting balance-Leahy Scale: Poor   Postural control: Posterior lean   Standing balance-Leahy Scale: Poor                               Pertinent Vitals/Pain Pain Assessment: No/denies pain Pain Location: Mid to low back. He is very frail. Pain Descriptors / Indicators: Aching;Heaviness    Home Living Family/patient expects to be discharged to:: Assisted living               Home Equipment: Walker - 4 wheels      Prior Function Level of Independence: Independent         Comments: Pt uses a 4WW for mobility and reports 0 falls in the last 6 months. Pt independent with ADLs, transfers, and mobility tasks. Pt does not use oxygen at home.     Hand Dominance   Dominant Hand: Left    Extremity/Trunk Assessment   Upper Extremity Assessment Upper Extremity Assessment: Defer to OT evaluation    Lower Extremity Assessment Lower Extremity Assessment: Defer to PT evaluation;Generalized weakness    Cervical / Trunk Assessment Cervical / Trunk Assessment: Kyphotic  Communication   Communication: Other (comment)  Cognition Arousal/Alertness: Awake/alert Behavior During Therapy: WFL for tasks assessed/performed Overall Cognitive Status: No family/caregiver present to determine baseline cognitive functioning  General Comments: Pt A&O x 4. Answering questions appropriately.      General Comments General comments (skin integrity, edema, etc.): Need to monitor skin integrity closely - he is very frail    Exercises     Assessment/Plan    PT Assessment Patient needs continued PT services  PT Problem List Decreased strength;Decreased mobility;Decreased safety awareness;Decreased range of motion;Decreased activity tolerance;Decreased balance;Cardiopulmonary status limiting activity;Decreased knowledge of use of DME       PT Treatment Interventions Gait training;DME  instruction;Neuromuscular re-education;Functional mobility training;Patient/family education;Therapeutic activities;Therapeutic exercise    PT Goals (Current goals can be found in the Care Plan section)  Acute Rehab PT Goals Patient Stated Goal: "wants to go back home" PT Goal Formulation: With patient Time For Goal Achievement: 05/09/19 Potential to Achieve Goals: Fair    Frequency Min 2X/week   Barriers to discharge        Co-evaluation               AM-PAC PT "6 Clicks" Mobility  Outcome Measure Help needed turning from your back to your side while in a flat bed without using bedrails?: A Lot Help needed moving from lying on your back to sitting on the side of a flat bed without using bedrails?: A Lot Help needed moving to and from a bed to a chair (including a wheelchair)?: A Lot Help needed standing up from a chair using your arms (e.g., wheelchair or bedside chair)?: A Lot Help needed to walk in hospital room?: A Lot Help needed climbing 3-5 steps with a railing? : Total 6 Click Score: 11    End of Session Equipment Utilized During Treatment: Oxygen Activity Tolerance: Patient limited by fatigue Patient left: in chair;with call bell/phone within reach;with bed alarm set Nurse Communication: Mobility status(RN in room at time of this session) PT Visit Diagnosis: Muscle weakness (generalized) (M62.81);Difficulty in walking, not elsewhere classified (R26.2)    Time: 2671-2458 PT Time Calculation (min) (ACUTE ONLY): 43 min   Charges:   PT Evaluation $PT Eval Moderate Complexity: 1 Mod PT Treatments $Therapeutic Activity: 23-37 mins        Rollen Sox, PT # [phone] CGV cell   Casandra Doffing 04/29/2019, 5:04 PM

## 2019-04-29 NOTE — Progress Notes (Signed)
Pt A&Ox3, OOB to recliner x4 hours, IVF NS@50ml /hr infusing without difficulty, elevated K+ Kayexelate given, pt having loose stools,cont to have poor po intake, drinking small amounts of soda throughout the shift, c/o generalized pain, refuses prn pain medication, O2@8lpm  via HFNC Sats in the high 80s to mid 90s, SOB on exertion, co-op with care will cont to monitor

## 2019-04-30 LAB — CBC WITH DIFFERENTIAL/PLATELET
Abs Immature Granulocytes: 0.14 10*3/uL — ABNORMAL HIGH (ref 0.00–0.07)
Basophils Absolute: 0 10*3/uL (ref 0.0–0.1)
Basophils Relative: 0 %
Eosinophils Absolute: 0.1 10*3/uL (ref 0.0–0.5)
Eosinophils Relative: 1 %
HCT: 32.9 % — ABNORMAL LOW (ref 39.0–52.0)
Hemoglobin: 10.9 g/dL — ABNORMAL LOW (ref 13.0–17.0)
Immature Granulocytes: 1 %
Lymphocytes Relative: 18 %
Lymphs Abs: 2.1 10*3/uL (ref 0.7–4.0)
MCH: 29.9 pg (ref 26.0–34.0)
MCHC: 33.1 g/dL (ref 30.0–36.0)
MCV: 90.1 fL (ref 80.0–100.0)
Monocytes Absolute: 0.3 10*3/uL (ref 0.1–1.0)
Monocytes Relative: 2 %
Neutro Abs: 8.8 10*3/uL — ABNORMAL HIGH (ref 1.7–7.7)
Neutrophils Relative %: 78 %
Platelets: 185 10*3/uL (ref 150–400)
RBC: 3.65 MIL/uL — ABNORMAL LOW (ref 4.22–5.81)
RDW: 15.4 % (ref 11.5–15.5)
WBC: 11.4 10*3/uL — ABNORMAL HIGH (ref 4.0–10.5)
nRBC: 0 % (ref 0.0–0.2)

## 2019-04-30 LAB — COMPREHENSIVE METABOLIC PANEL
ALT: 21 U/L (ref 0–44)
AST: 32 U/L (ref 15–41)
Albumin: 2.9 g/dL — ABNORMAL LOW (ref 3.5–5.0)
Alkaline Phosphatase: 104 U/L (ref 38–126)
Anion gap: 8 (ref 5–15)
BUN: 83 mg/dL — ABNORMAL HIGH (ref 8–23)
CO2: 19 mmol/L — ABNORMAL LOW (ref 22–32)
Calcium: 8.7 mg/dL — ABNORMAL LOW (ref 8.9–10.3)
Chloride: 120 mmol/L — ABNORMAL HIGH (ref 98–111)
Creatinine, Ser: 1.37 mg/dL — ABNORMAL HIGH (ref 0.61–1.24)
GFR calc Af Amer: 56 mL/min — ABNORMAL LOW (ref >60)
GFR calc non Af Amer: 49 mL/min — ABNORMAL LOW (ref >60)
Glucose, Bld: 111 mg/dL — ABNORMAL HIGH (ref 70–99)
Potassium: 5 mmol/L (ref 3.5–5.1)
Sodium: 147 mmol/L — ABNORMAL HIGH (ref 135–145)
Total Bilirubin: 0.6 mg/dL (ref 0.3–1.2)
Total Protein: 5.7 g/dL — ABNORMAL LOW (ref 6.5–8.1)

## 2019-04-30 LAB — D-DIMER, QUANTITATIVE: D-Dimer, Quant: 3.55 ug/mL-FEU — ABNORMAL HIGH (ref 0.00–0.50)

## 2019-04-30 LAB — C-REACTIVE PROTEIN: CRP: 1.4 mg/dL — ABNORMAL HIGH (ref <1.0)

## 2019-04-30 LAB — BRAIN NATRIURETIC PEPTIDE: B Natriuretic Peptide: 69.8 pg/mL (ref 0.0–100.0)

## 2019-04-30 LAB — MAGNESIUM: Magnesium: 2.5 mg/dL — ABNORMAL HIGH (ref 1.7–2.4)

## 2019-04-30 MED ORDER — MAGIC MOUTHWASH W/LIDOCAINE
5.0000 mL | Freq: Four times a day (QID) | ORAL | Status: DC | PRN
  Administered 2019-04-30 – 2019-05-02 (×2): 5 mL via ORAL
  Filled 2019-04-30 (×3): qty 5

## 2019-04-30 MED ORDER — DEXTROSE 5 % IV SOLN: INTRAVENOUS | Status: DC

## 2019-04-30 MED ORDER — SODIUM POLYSTYRENE SULFONATE 15 GM/60ML PO SUSP
15.0000 g | Freq: Once | ORAL | Status: AC
  Administered 2019-04-30: 15 g via ORAL
  Filled 2019-04-30: qty 60

## 2019-04-30 MED ORDER — FUROSEMIDE 10 MG/ML IJ SOLN
20.0000 mg | Freq: Once | INTRAMUSCULAR | Status: AC
  Administered 2019-04-30: 20 mg via INTRAVENOUS
  Filled 2019-04-30: qty 2

## 2019-04-30 NOTE — Progress Notes (Signed)
Pt getting progressively weaker, cont on non rebreather mask 15L Sats 88-93%, K+ elevated kayexalate given as ordered, condom cath placed d/t pt not able to place urinal, bed bath given and linen changed. C/o pain to tongue, tongue dry and swollen, magic mouthwash order with lidocaine for comfort. Respirations and HR elevated, poor po intake, managing small sips of ginger ale, refused food. Co-op with care will cont to monitor.

## 2019-04-30 NOTE — Progress Notes (Addendum)
PROGRESS NOTE                                                                                                                                                                                                             Patient Demographics:    William Wilcox, is a 79 y.o. male, DOB - 04-Sep-1940, DPO:[id number]  Outpatient Primary MD for the patient is [place] date - 04/24/2019   LOS - 6  No chief complaint on file.      Brief Narrative: Patient is a 79 y.o. male with PMHx of COPD, CAD, HTN who presented to [facility] on 1/11 with fever, cough, shortness of breath-he initially was on 3 L of oxygen-however post admission-his hypoxemia worsened needing 15 L of HFNC-patient was subsequently transferred to [facility] on 1/12.  See below for further details.  COVID-19 Medications: Steroids: 1/11>> Remdesivir: 1/11>>1/15 Actemra: 1/13 x 1   Subjective:   Patient in bed appears confused but in no distress, denies any headache chest or abdominal pain, mild shortness of breath.   Assessment  & Plan :   Acute Hypoxic Resp Failure due to Covid 19 Viral pneumonia: He had severe disease and was treated with IV steroids, remdesivir and Actemra.  He continues to have significant hypoxia and oxygen need, overall extremely frail with poor oral intake, I doubt he will survive this admission, he is overall DNR, if he declines he should be transition to full comfort care, plan was discussed with weekend social worker for [facility] [name] by me on 04/30/2019 at 1:30 PM.  As he has developed rails will give him a trial of IV Lasix.  For now hypernatremia needs to be tolerated.  Encouraged to sit up in chair and daytime use I-S and flutter valve for pulmonary toiletry and prone in bed at night.  O2 requirements:  SpO2: (!) 88 % O2 Flow Rate (L/min): 15 L/min FiO2 (%): 100 %   COVID-19 Labs: Recent Labs     04/28/19 0150 04/29/19 0135 04/30/19 0154  DDIMER 2.51* 2.49* 3.55*  FERRITIN 439* 425*  --   CRP 7.5* 3.4* 1.4*       Component Value Date/Time   BNP 69.8 04/30/2019 0155    Recent Labs  Lab 04/23/19 1708  PROCALCITON <0.10    Lab  Results  Component Value Date   SARSCOV2NAA NEGATIVE 02/01/2019       AKI on CKD stage IIIa: Resolved after hydration but now developing rails requiring IV fluids, again condition is tenuous.  Will have to ignore AKI and hyponatremia for now.  Chronic diastolic heart failure: Mild acute decompensation on 04/30/2019, Lasix and monitor.  COPD: Not in exacerbation-continue inhalers  History of CAD s/p CABG: No anginal symptoms-continue aspirin and statin  HTN: BP on the soft side-continue to hold all antihypertensive agents for now.  Depression/anxiety: Appears stable-continue Zyprexa, Zoloft trazodone and Xanax.  Deconditioning/debility: Appears very frail-has significant atrophy of his calf muscles.  Not sure what his ambulatory status is baseline is.  Obtaining PT/OT.  Failure to thrive syndrome: Appears to be very frail at baseline-with more weakness-and very poor appetite.  Have asked RN to see if we can help him with feeding-continue mobilization with PT/RN-out of bed to chair etc.  Goals of care: Spoke with Alexis-legal guardian at [facility] social services on 1/15-given his poor overall health situation-and acute illness with Covid-we both agree that patient is best served by DNR status.  I have reconfirmed this with Alexis-and have placed orders in accordingly.  Consults  :  None  Procedures  :  None  Condition - Extremely Guarded  Family Communication:  By previous MD - Although has a legal guardian-I have updated this patient's daughter-Debbie daily over the past few days.  Updated her today on 1/16-she is aware that given patient's overall frailty/poor health-he is not a candidate for aggressive care-and that if he were  to deteriorate-he would be hospice appropriate.  Legal egal guardian at [facility] social services Jon Gills at [phone]) updated updated over the phone-1/15.   If over the weekend-any emergency issues arise-call [facility] on call at [phone].  The main number at [facility] social services [phone]   Code Status :  Full Code  Diet :  Diet Order            DIET DYS 3 Room service appropriate? Yes; Fluid consistency: Thin  Diet effective now               Disposition Plan  :  Remain hospitalized-back to SNF when he is ready for discharge.  Barriers to discharge: Hypoxia requiring O2 supplementation  Antimicorbials  :    Anti-infectives (From admission, onward)   Start     Dose/Rate Route Frequency Ordered Stop   04/25/19 1000  remdesivir 100 mg in sodium chloride 0.9 % 100 mL IVPB     100 mg 200 mL/hr over 30 Minutes Intravenous Daily 04/24/19 1921 04/27/19 1149      DVT Prophylaxis  :  Lovenox   Inpatient Medications  Scheduled Meds:  vitamin C  500 mg Oral Daily   aspirin EC  81 mg Oral Daily   docusate sodium  100 mg Oral Daily   enoxaparin (LOVENOX) injection  40 mg Subcutaneous Q24H   feeding supplement (ENSURE ENLIVE)  237 mL Oral TID BM   Ipratropium-Albuterol  1 puff Inhalation Q6H   Melatonin  6 mg Oral QHS   methylPREDNISolone (SOLU-MEDROL) injection  40 mg Intravenous Daily   midodrine  2.5 mg Oral BID WC   nicotine  21 mg Transdermal Daily   OLANZapine  2.5 mg Oral QHS   prazosin  5 mg Oral QHS   sertraline  100 mg Oral Daily   simvastatin  40 mg Oral QHS   traZODone  50 mg Oral QHS   umeclidinium bromide  1 puff Inhalation Daily   zinc sulfate  220 mg Oral Daily   Continuous Infusions:  PRN Meds:.acetaminophen, ALPRAZolam, chlorpheniramine-HYDROcodone, guaiFENesin-dextromethorphan, loperamide, nitroGLYCERIN, [DISCONTINUED] ondansetron **OR** ondansetron (ZOFRAN) IV, phenol, traMADol   Time Spent  in minutes 35   See all Orders from today for further details   Susa Raring M.D on 04/30/2019 at 11:31 AM  To page go to [email or web address] - use universal password  [facility] -  Office  [phone]    Objective:   Vitals:   04/30/19 0100 04/30/19 0200 04/30/19 0700 04/30/19 0751  BP:    (!) 154/63  Pulse: 88 84  (!) 109  Resp: (!) 25 (!) 25  (!) 27  Temp:   (!) 97 F (36.1 C) (!) 97.4 F (36.3 C)  TempSrc:   Axillary Oral  SpO2: 95% 95% 91% (!) 88%  Weight:      Height:        Wt Readings from Last 3 Encounters:  04/25/19 57 kg  04/23/19 60.9 kg  02/18/19 60.9 kg     Intake/Output Summary (Last 24 hours) at 04/30/2019 1131 Last data filed at 04/30/2019 1028 Gross per 24 hour  Intake 180 ml  Output 1395 ml  Net -1215 ml     Physical Exam  Awake, mildly confused, appears comfortable but requiring quite a bit of oxygen with nasal cannula and nonrebreather,  Rowan.AT,PERRAL Supple Neck,No JVD, No cervical lymphadenopathy appriciated.  Symmetrical Chest wall movement, Good air movement bilaterally, + rales RRR,No Gallops, Rubs or new Murmurs, No Parasternal Heave  +ve B.Sounds, Abd Soft, No tenderness, No organomegaly appriciated, No rebound - guarding or rigidity. No Cyanosis, Clubbing or edema, No new Rash or bruise    Data Review:    CBC Recent Labs  Lab 04/26/19 0103 04/27/19 0038 04/28/19 0150 04/29/19 0135 04/30/19 0154  WBC 9.8 8.0 8.9 10.3 11.4*  HGB 10.8* 10.3* 10.5* 10.5* 10.9*  HCT 31.6* 31.1* 31.1* 32.0* 32.9*  PLT 166 181 181 186 185  MCV 89.3 90.1 90.1 90.7 90.1  MCH 30.5 29.9 30.4 29.7 29.9  MCHC 34.2 33.1 33.8 32.8 33.1  RDW 15.3 15.5 15.5 15.4 15.4  LYMPHSABS 2.6 2.4 2.1 2.3 2.1  MONOABS 0.3 0.4 0.4 0.3 0.3  EOSABS 0.0 0.0 0.0 0.0 0.1  BASOSABS 0.0 0.0 0.0 0.0 0.0    Chemistries  Recent Labs  Lab 04/26/19 0103 04/27/19 0038 04/28/19 0150 04/29/19 0135 04/30/19 0154  NA 138 141 138 144 147*  K 4.5 4.3 4.8 5.5* 5.0    CL 106 107 107 114* 120*  CO2 21* 21* 20* 21* 19*  GLUCOSE 138* 150* 149* 117* 111*  BUN 82* 92* 119* 94* 83*  CREATININE 1.76* 2.19* 1.96* 1.30* 1.37*  CALCIUM 8.7* 8.8* 8.4* 9.0 8.7*  MG  --   --   --   --  2.5*  AST 29 23 24 30  32  ALT 14 13 16 20 21   ALKPHOS 72 80 83 96 104  BILITOT 0.6 0.9 0.5 0.6 0.6   ------------------------------------------------------------------------------------------------------------------ No results for input(s): CHOL, HDL, LDLCALC, TRIG, CHOLHDL, LDLDIRECT in the last 72 hours.  No results found for: HGBA1C ------------------------------------------------------------------------------------------------------------------ No results for input(s): TSH, T4TOTAL, T3FREE, THYROIDAB in the last 72 hours.  Invalid input(s): FREET3 ------------------------------------------------------------------------------------------------------------------ Recent Labs    04/28/19 0150 04/29/19 0135  FERRITIN 439* 425*    Coagulation profile No results for input(s): INR, PROTIME in the last 168 hours.  Recent Labs    04/29/19 0135 04/30/19 0154  DDIMER 2.49* 3.55*    Cardiac Enzymes No results for input(s): CKMB, TROPONINI, MYOGLOBIN in the last 168 hours.  Invalid input(s): CK ------------------------------------------------------------------------------------------------------------------    Component Value Date/Time   BNP 69.8 04/30/2019 0155    Micro Results Recent Results (from the past 240 hour(s))  Blood Culture (routine x 2)     Status: None   Collection Time: 04/23/19 10:31 AM   Specimen: BLOOD  Result Value Ref Range Status   Specimen Description BLOOD LEFT ANTECUBITAL  Final   Special Requests   Final    BOTTLES DRAWN AEROBIC AND ANAEROBIC Blood Culture results may not be optimal due to an excessive volume of blood received in culture bottles   Culture   Final    NO GROWTH 5 DAYS Performed at [facility], [address] [place], [place] [address]    Report Status 04/28/2019 FINAL  Final  Blood Culture (routine x 2)     Status: None   Collection Time: 04/23/19 10:31 AM   Specimen: BLOOD  Result Value Ref Range Status   Specimen Description BLOOD BLOOD RIGHT FOREARM  Final   Special Requests   Final    BOTTLES DRAWN AEROBIC AND ANAEROBIC Blood Culture results may not be optimal due to an excessive volume of blood received in culture bottles   Culture   Final    NO GROWTH 5 DAYS Performed at [facility], [address], [place], [place] [address]    Report Status 04/28/2019 FINAL  Final  Urine culture     Status: Abnormal   Collection Time: 04/23/19  5:08 PM   Specimen: In/Out Cath Urine  Result Value Ref Range Status   Specimen Description   Final    IN/OUT CATH URINE Performed at [facility], [address] [place], [place] [address]    Special Requests   Final    NONE Performed at [facility], [address] [place], [place] [address]    Culture MULTIPLE SPECIES PRESENT, SUGGEST RECOLLECTION (A)  Final   Report Status 04/25/2019 FINAL  Final    Radiology Reports DG Chest Port 1 View  Result Date: 04/23/2019 CLINICAL DATA:  Fever. EXAM: PORTABLE CHEST 1 VIEW COMPARISON:  February 01, 2019. FINDINGS: Stable cardiomediastinal silhouette. Status post coronary bypass graft. No pneumothorax or pleural effusion is noted. Mild bilateral ill-defined airspace opacities are noted concerning for edema or possibly multifocal pneumonia. Bony thorax is unremarkable. IMPRESSION: Mild bilateral ill-defined airspace opacities are noted concerning for edema or possibly multifocal pneumonia. Followup radiographs are recommended until resolution. Electronically Signed   By: Lupita Raider M.D.   On: 04/23/2019 10:25

## 2019-04-30 NOTE — Progress Notes (Signed)
OT Cancellation Note  Patient Details Name: William Wilcox MRN: [id number] DOB: Feb 09, 1941   Cancelled Treatment:    Reason Eval/Treat Not Completed: Medical issues which prohibited therapy. Upon OT arrival, pt semi-reclined in bed with SpO2 at 67% and RN in room. Per RN, pt has been taking his NRB off. RN requested therapy to hold this date due to pt's increased O2 needs and WOB. OT will continue to follow acutely.   Peterson Ao 04/30/2019, 11:51 AM

## 2019-04-30 NOTE — Progress Notes (Signed)
Pt continues to be confused to time and date/day of week. Pt oxygen demands increased throughout PM, pt is now on NRB @15L  with good sats seen. This RN encouraging pt to use mouth moistorizer, ice chips and even warm water soaks to his lips/mouth, pt agreeable for short time. Pts tongue extremely dry, cracked, sore, with scab-like crusty areas. Pt states has throat discomfort with swallowing, but refuses to use chloraseptic. RN teaching with both issues above, with pts refusal to perform interventions mentioned. Bladder scans done per orders, with less than noted each time.  Pt using urinal at bedtime as swell. Pt continues to have poor appetite and fluid intake this shift. IVF's at 51ml/hr. No loose stools noted this shift. Will cont to monitor and report findings or changes.

## 2019-05-01 LAB — CBC WITH DIFFERENTIAL/PLATELET
Abs Immature Granulocytes: 0.22 10*3/uL — ABNORMAL HIGH (ref 0.00–0.07)
Basophils Absolute: 0 10*3/uL (ref 0.0–0.1)
Basophils Relative: 0 %
Eosinophils Absolute: 0.1 10*3/uL (ref 0.0–0.5)
Eosinophils Relative: 1 %
HCT: 35.1 % — ABNORMAL LOW (ref 39.0–52.0)
Hemoglobin: 11.6 g/dL — ABNORMAL LOW (ref 13.0–17.0)
Immature Granulocytes: 1 %
Lymphocytes Relative: 19 %
Lymphs Abs: 2.9 10*3/uL (ref 0.7–4.0)
MCH: 30.1 pg (ref 26.0–34.0)
MCHC: 33 g/dL (ref 30.0–36.0)
MCV: 91.2 fL (ref 80.0–100.0)
Monocytes Absolute: 0.3 10*3/uL (ref 0.1–1.0)
Monocytes Relative: 2 %
Neutro Abs: 12.1 10*3/uL — ABNORMAL HIGH (ref 1.7–7.7)
Neutrophils Relative %: 77 %
Platelets: 170 10*3/uL (ref 150–400)
RBC: 3.85 MIL/uL — ABNORMAL LOW (ref 4.22–5.81)
RDW: 15.7 % — ABNORMAL HIGH (ref 11.5–15.5)
WBC: 15.7 10*3/uL — ABNORMAL HIGH (ref 4.0–10.5)
nRBC: 0 % (ref 0.0–0.2)

## 2019-05-01 LAB — COMPREHENSIVE METABOLIC PANEL
ALT: 23 U/L (ref 0–44)
AST: 37 U/L (ref 15–41)
Albumin: 2.9 g/dL — ABNORMAL LOW (ref 3.5–5.0)
Alkaline Phosphatase: 153 U/L — ABNORMAL HIGH (ref 38–126)
Anion gap: 10 (ref 5–15)
BUN: 69 mg/dL — ABNORMAL HIGH (ref 8–23)
CO2: 22 mmol/L (ref 22–32)
Calcium: 8.6 mg/dL — ABNORMAL LOW (ref 8.9–10.3)
Chloride: 121 mmol/L — ABNORMAL HIGH (ref 98–111)
Creatinine, Ser: 1.46 mg/dL — ABNORMAL HIGH (ref 0.61–1.24)
GFR calc Af Amer: 52 mL/min — ABNORMAL LOW (ref >60)
GFR calc non Af Amer: 45 mL/min — ABNORMAL LOW (ref >60)
Glucose, Bld: 124 mg/dL — ABNORMAL HIGH (ref 70–99)
Potassium: 3.6 mmol/L (ref 3.5–5.1)
Sodium: 153 mmol/L — ABNORMAL HIGH (ref 135–145)
Total Bilirubin: 0.6 mg/dL (ref 0.3–1.2)
Total Protein: 6 g/dL — ABNORMAL LOW (ref 6.5–8.1)

## 2019-05-01 LAB — BRAIN NATRIURETIC PEPTIDE: B Natriuretic Peptide: 122.8 pg/mL — ABNORMAL HIGH (ref 0.0–100.0)

## 2019-05-01 LAB — C-REACTIVE PROTEIN: CRP: 5.8 mg/dL — ABNORMAL HIGH (ref <1.0)

## 2019-05-01 LAB — D-DIMER, QUANTITATIVE: D-Dimer, Quant: 7.32 ug/mL-FEU — ABNORMAL HIGH (ref 0.00–0.50)

## 2019-05-01 LAB — MAGNESIUM: Magnesium: 2.5 mg/dL — ABNORMAL HIGH (ref 1.7–2.4)

## 2019-05-01 MED ORDER — OLANZAPINE 10 MG IM SOLR
5.0000 mg | Freq: Once | INTRAMUSCULAR | Status: DC | PRN
  Filled 2019-05-01: qty 10

## 2019-05-01 MED ORDER — SODIUM CHLORIDE 0.9 % IV SOLN
INTRAVENOUS | Status: DC
  Administered 2019-05-01: 75 mL/h via INTRAVENOUS

## 2019-05-01 NOTE — TOC Progression Note (Signed)
Transition of Care [facility]) - Progression Note    Patient Details  Name: William Wilcox MRN: [id number] Date of Birth: Feb 06, 1941  [facility] CM/SW Contact  Armanda Heritage, RN Phone Number: 05/01/2019, 1:23 PM  Clinical Narrative:    CM noted referral for residential hospice. Choice offered to legal guardian. [facility], referral given to rep.     Expected Discharge Plan: Hospice Medical Facility Barriers to Discharge: Continued Medical Work up  Expected Discharge Plan and Services Expected Discharge Plan: Hospice Medical Facility   Discharge Planning Services: CM Consult Post Acute Care Choice: Hospice Living arrangements for the past 2 months: Assisted Living Facility                                       Social Determinants of Health (SDOH) Interventions    Readmission Risk Interventions No flowsheet data found.

## 2019-05-01 NOTE — Progress Notes (Deleted)
Notified MD and charge

## 2019-05-01 NOTE — Progress Notes (Signed)
PROGRESS NOTE                                                                                                                                                                                                             Patient Demographics:    William Wilcox, is a 79 y.o. male, DOB - 1940-10-01, EUM:[id number]  Outpatient Primary MD for the [facility] is [facility]   Admit date - 04/24/2019   LOS - 7  No chief complaint on file.      Brief Narrative: Patient is a 79 y.o. male with PMHx of COPD, CAD, HTN who presented to [facility] on 1/11 with fever, cough, shortness of breath-he initially was on 3 L of oxygen-however post admission-his hypoxemia worsened needing 15 L of HFNC-patient was subsequently transferred to [facility] on 1/12.  See below for further details.  COVID-19 Medications: Steroids: 1/11>> Remdesivir: 1/11>>1/15 Actemra: 1/13 x 1   Subjective:   Patient in bed appears confused but in no distress, denies any headache chest or abdominal pain, he is still quite short of breath.   Assessment  & Plan :   Acute Hypoxic Resp Failure due to Covid 19 Viral pneumonia: He had severe disease and was treated with IV steroids, remdesivir and Actemra.  He continues to have significant hypoxia and oxygen need, overall extremely frail with poor oral intake, he continues to be severely hypoxic requiring both high flow nasal cannula oxygen and nonrebreather mask, he is clearly declining and continues to suffer, will transition him to comfort measures, palliative care consulted will look for hospice bed if he survives for the next few days, plan was discussed with weekend social worker for [facility] Mr. Lorin Picket by me on 04/30/2019 at 1:30 PM and subsequently with his supervisor as well.  O2 requirements:  SpO2: 90 % O2 Flow Rate (L/min): 15 L/min FiO2 (%): 100 %   COVID-19 Labs: Recent Labs     04/29/19 0135 04/30/19 0154 05/01/19 0415  DDIMER 2.49* 3.55* 7.32*  FERRITIN 425*  --   --   CRP 3.4* 1.4* 5.8*       Component Value Date/Time   BNP 122.8 (H) 05/01/2019 0415    No results for input(s): PROCALCITON in the last 168 hours.  Lab Results  Component Value Date   SARSCOV2NAA NEGATIVE 02/01/2019  AKI on CKD stage IIIa: Resolved after hydration but now developing rails requiring IV fluids, again condition is tenuous.  Will have to ignore AKI and hyponatremia for now.  Chronic diastolic heart failure: Mild acute decompensation on 04/30/2019, Lasix and monitor.  COPD: Not in exacerbation-continue inhalers  History of CAD s/p CABG: No anginal symptoms-continue aspirin and statin  HTN: BP on the soft side-continue to hold all antihypertensive agents for now.  Depression/anxiety: Appears stable-continue Zyprexa, Zoloft trazodone and Xanax.  Deconditioning/debility: Appears very frail-has significant atrophy of his calf muscles.  Not sure what his ambulatory status is baseline is.  Obtaining PT/OT.  Failure to thrive syndrome: Appears to be very frail at baseline-with more weakness-and very poor appetite.  Have asked RN to see if we can help him with feeding-continue mobilization with PT/RN-out of bed to chair etc.  Goals of care: Spoke with Alexis-legal guardian at [facility] social services on 1/15-given his poor overall health situation-and acute illness with Covid-we both agree that patient is best served by DNR status.  I have reconfirmed this with Alexis-and have placed orders in accordingly.  Consults  : Palliative care  Procedures  :  None  Condition - Extremely Guarded  Family Communication:  By previous MD - Although has a legal guardian-I have updated this patient's daughter-Debbie daily over the past few days.  Updated her today on 1/16-she is aware that given patient's overall frailty/poor health-he is not a candidate for aggressive care-and  that if he were to deteriorate-he would be hospice appropriate.  Legal egal guardian at [facility] social services Jon Gills at [phone]) updated updated over the phone-1/15.   If over the weekend-any emergency issues arise-call [facility] on call at [phone].  The main number at [facility] social services [phone]  Plan was discussed with weekend social worker for [facility] Mr. Lorin Picket by me on 04/30/2019 at 1:30 PM and subsequently with his supervisor as well.    Code Status : DNR  Diet :  Diet Order            DIET DYS 3 Room service appropriate? Yes; Fluid consistency: Thin  Diet effective now               Disposition Plan  : Residential hospice or SNF with palliative care.  Barriers to discharge: Hypoxia requiring O2 supplementation  Antimicorbials  :    Anti-infectives (From admission, onward)   Start     Dose/Rate Route Frequency Ordered Stop   04/25/19 1000  remdesivir 100 mg in sodium chloride 0.9 % 100 mL IVPB     100 mg 200 mL/hr over 30 Minutes Intravenous Daily 04/24/19 1921 04/27/19 1149      DVT Prophylaxis  :  Lovenox   Inpatient Medications  Scheduled Meds: . aspirin EC  81 mg Oral Daily  . docusate sodium  100 mg Oral Daily  . enoxaparin (LOVENOX) injection  40 mg Subcutaneous Q24H  . feeding supplement (ENSURE ENLIVE)  237 mL Oral TID BM  . Ipratropium-Albuterol  1 puff Inhalation Q6H  . Melatonin  6 mg Oral QHS  . midodrine  2.5 mg Oral BID WC  . nicotine  21 mg Transdermal Daily  . OLANZapine  2.5 mg Oral QHS  . prazosin  5 mg Oral QHS  . sertraline  100 mg Oral Daily  . traZODone  50 mg Oral QHS  . umeclidinium bromide  1 puff Inhalation Daily   Continuous Infusions:  PRN Meds:.acetaminophen,  ALPRAZolam, chlorpheniramine-HYDROcodone, guaiFENesin-dextromethorphan, loperamide, magic mouthwash w/lidocaine, nitroGLYCERIN, OLANZapine, [DISCONTINUED] ondansetron **OR** ondansetron (ZOFRAN) IV, phenol,  traMADol   Time Spent in minutes 35   See all Orders from today for further details   Susa Raring M.D on 05/01/2019 at 11:19 AM  To page go to [email or web address] - use universal password  [facility] -  Office  [phone]    Objective:   Vitals:   05/01/19 0730 05/01/19 0800 05/01/19 0900 05/01/19 1000  BP: (!) 153/51 (!) 161/55 (!) 152/56 (!) 152/59  Pulse: (!) 103 100 (!) 105 (!) 104  Resp: (!) 28 (!) 22 (!) 25 (!) 23  Temp:  (!) 96.9 F (36.1 C)    TempSrc:  Axillary    SpO2: (!) 86% 90% (!) 89% 90%  Weight:      Height:        Wt Readings from Last 3 Encounters:  04/25/19 57 kg  04/23/19 60.9 kg  02/18/19 60.9 kg     Intake/Output Summary (Last 24 hours) at 05/01/2019 1119 Last data filed at 04/30/2019 1930 Gross per 24 hour  Intake 60 ml  Output 100 ml  Net -40 ml     Physical Exam  Awake, mildly confused, appears comfortable but requiring quite a bit of oxygen with nasal cannula and nonrebreather,  [place].AT,PERRAL Supple Neck,No JVD, No cervical lymphadenopathy appriciated.  Symmetrical Chest wall movement, Good air movement bilaterally, + rales RRR,No Gallops, Rubs or new Murmurs, No Parasternal Heave  +ve B.Sounds, Abd Soft, No tenderness, No organomegaly appriciated, No rebound - guarding or rigidity. No Cyanosis, Clubbing or edema, No new Rash or bruise    Data Review:    CBC Recent Labs  Lab 04/27/19 0038 04/28/19 0150 04/29/19 0135 04/30/19 0154 05/01/19 0415  WBC 8.0 8.9 10.3 11.4* 15.7*  HGB 10.3* 10.5* 10.5* 10.9* 11.6*  HCT 31.1* 31.1* 32.0* 32.9* 35.1*  PLT 181 181 186 185 170  MCV 90.1 90.1 90.7 90.1 91.2  MCH 29.9 30.4 29.7 29.9 30.1  MCHC 33.1 33.8 32.8 33.1 33.0  RDW 15.5 15.5 15.4 15.4 15.7*  LYMPHSABS 2.4 2.1 2.3 2.1 2.9  MONOABS 0.4 0.4 0.3 0.3 0.3  EOSABS 0.0 0.0 0.0 0.1 0.1  BASOSABS 0.0 0.0 0.0 0.0 0.0    Chemistries  Recent Labs  Lab 04/27/19 0038 04/28/19 0150 04/29/19 0135 04/30/19 0154  05/01/19 0415  NA 141 138 144 147* 153*  K 4.3 4.8 5.5* 5.0 3.6  CL 107 107 114* 120* 121*  CO2 21* 20* 21* 19* 22  GLUCOSE 150* 149* 117* 111* 124*  BUN 92* 119* 94* 83* 69*  CREATININE 2.19* 1.96* 1.30* 1.37* 1.46*  CALCIUM 8.8* 8.4* 9.0 8.7* 8.6*  MG  --   --   --  2.5* 2.5*  AST 23 24 30  32 37  ALT 13 16 20 21 23   ALKPHOS 80 83 96 104 153*  BILITOT 0.9 0.5 0.6 0.6 0.6   ------------------------------------------------------------------------------------------------------------------ No results for input(s): CHOL, HDL, LDLCALC, TRIG, CHOLHDL, LDLDIRECT in the last 72 hours.  No results found for: HGBA1C ------------------------------------------------------------------------------------------------------------------ No results for input(s): TSH, T4TOTAL, T3FREE, THYROIDAB in the last 72 hours.  Invalid input(s): FREET3 ------------------------------------------------------------------------------------------------------------------ Recent Labs    04/29/19 0135  FERRITIN 425*    Coagulation profile No results for input(s): INR, PROTIME in the last 168 hours.  Recent Labs    04/30/19 0154 05/01/19 0415  DDIMER 3.55* 7.32*    Cardiac Enzymes No results for input(s): CKMB, TROPONINI, MYOGLOBIN in the  last 168 hours.  Invalid input(s): CK ------------------------------------------------------------------------------------------------------------------    Component Value Date/Time   BNP 122.8 (H) 05/01/2019 0415    Micro Results Recent Results (from the past 240 hour(s))  Blood Culture (routine x 2)     Status: None   Collection Time: 04/23/19 10:31 AM   Specimen: BLOOD  Result Value Ref Range Status   Specimen Description BLOOD LEFT ANTECUBITAL  Final   Special Requests   Final    BOTTLES DRAWN AEROBIC AND ANAEROBIC Blood Culture results may not be optimal due to an excessive volume of blood received in culture bottles   Culture   Final    NO GROWTH 5  DAYS Performed at [facility], [address] [place], [place] [address]    Report Status 04/28/2019 FINAL  Final  Blood Culture (routine x 2)     Status: None   Collection Time: 04/23/19 10:31 AM   Specimen: BLOOD  Result Value Ref Range Status   Specimen Description BLOOD BLOOD RIGHT FOREARM  Final   Special Requests   Final    BOTTLES DRAWN AEROBIC AND ANAEROBIC Blood Culture results may not be optimal due to an excessive volume of blood received in culture bottles   Culture   Final    NO GROWTH 5 DAYS Performed at [facility], [address] [place], [place] [address]    Report Status 04/28/2019 FINAL  Final  Urine culture     Status: Abnormal   Collection Time: 04/23/19  5:08 PM   Specimen: In/Out Cath Urine  Result Value Ref Range Status   Specimen Description   Final    IN/OUT CATH URINE Performed at [facility], [address], [place], [place] [address]    Special Requests   Final    NONE Performed at [facility], [address] [place], [place] [address]    Culture MULTIPLE SPECIES PRESENT, SUGGEST RECOLLECTION (A)  Final   Report Status 04/25/2019 FINAL  Final    Radiology Reports DG Chest Port 1 View  Result Date: 04/23/2019 CLINICAL DATA:  Fever. EXAM: PORTABLE CHEST 1 VIEW COMPARISON:  February 01, 2019. FINDINGS: Stable cardiomediastinal silhouette. Status post coronary bypass graft. No pneumothorax or pleural effusion is noted. Mild bilateral ill-defined airspace opacities are noted concerning for edema or possibly multifocal pneumonia. Bony thorax is unremarkable. IMPRESSION: Mild bilateral ill-defined airspace opacities are noted concerning for edema or possibly multifocal pneumonia. Followup radiographs are recommended until resolution. Electronically Signed   By: Marijo Conception M.D.   On: 04/23/2019 10:25

## 2019-05-01 NOTE — Progress Notes (Signed)
   05/01/19 0717  Vitals  BP (!) 166/83  MAP (mmHg) 108  Pulse Rate (!) 115  ECG Heart Rate (!) 118  Resp (!) 24  Oxygen Therapy  SpO2 (!) 76 %  O2 Device HFNC;Non-rebreather Mask  O2 Flow Rate (L/min) 15 L/min  FiO2 (%) 100 %  MEWS Score  MEWS Temp 1  MEWS Systolic 0  MEWS Pulse 2  MEWS RR 1  MEWS LOC 0  MEWS Score 4  MEWS Score Color Red

## 2019-05-01 NOTE — Progress Notes (Addendum)
Pt resting comfortably this am, pt oxgen demand increased last PM, placed pt on 10LHFNC as well as the 15LNRB, sats in the mid 90's. Pts has been tachycardic throughout night, like was during yesterday afternoon hours. Pt Pulling off mask and nasal canula last evning despite the RN redirection, Mitts applied loosely to hands. Urinating via condom cath, hematuria noted. MD aware. BP continues slightly high, meds as as per MAR. Order for new IM med this am for agitation obtained. Bladder scans unremarkable, <148ml through night. Pt daughter called this am looking for information on her father, this RN instructed her to call pts SW/case manager for information, and the reasoning behing this need. Understanding established and verbalized. IVF started this am hours as pts tongue continues to be sore, with crusty lesions and redness, most probably pt needing fluid hydration, and due to taking very little by mouth, order for magic mouthwash obtained as well. Report to be given next shift. RN. Pt stable and VSS wnl at time of this RNs departure from the PCU.

## 2019-05-01 NOTE — Progress Notes (Signed)
Red MEWS. Notified MD pt currently on 15L HF and 15L NRB sat 82-86%. Tachy 105-120's. Charge nurse notified as well. Order for palliative consult. Will continue to monitor.

## 2019-05-02 MED ORDER — HYDROMORPHONE HCL 1 MG/ML IJ SOLN
0.5000 mg | Freq: Once | INTRAMUSCULAR | Status: AC
  Administered 2019-05-02: 0.5 mg via INTRAVENOUS
  Filled 2019-05-02: qty 0.5

## 2019-05-02 MED ORDER — MORPHINE SULFATE (CONCENTRATE) 10 MG/0.5ML PO SOLN: 10.0000 mg | ORAL | 0 refills | Status: AC | PRN

## 2019-05-02 NOTE — Progress Notes (Signed)
Arranged "end of life visit" @1130 

## 2019-05-02 NOTE — Progress Notes (Signed)
Earlier in shift, pt moaning and squinting with ascultation of abdominal sounds, noding head yes when asked if has GI upset, Zofran was given as per order, noted good relief with above as pt is resting comfortably currently

## 2019-05-02 NOTE — Progress Notes (Signed)
Pt is urniating, sufficient amts. Abdomen soft and non distended, denies pain in abdomen. Bladder scans WNL this shigt, no need for cathetarization

## 2019-05-02 NOTE — Progress Notes (Signed)
Pt status unchanged since start of this shift, pt stares continually, attempts to follow some commands, seems uncomfortable, all meds are either P.O. or IM, spoke to Dr Karren Burly, he will add/change pain med orders/agitation order to offer IV meds.

## 2019-05-02 NOTE — Discharge Summary (Signed)
William Wilcox VIF:[id number] DOB: 1941/01/26 DOA: 04/24/2019  [facility]P: [facility]  Admit date: 04/24/2019  Discharge date: 05/08/2019  Admitted From: SNF  Disposition:  R.Hospice   Recommendations for Outpatient Follow-up:   Follow up with PCP in 1-2 weeks  PCP Please obtain BMP/CBC, 2 view CXR in 1week,  (see Discharge instructions)   PCP Please follow up on the following pending results:    Home Health:    Equipment/Devices:   Consultations: None  Discharge Condition: Guarded   CODE STATUS: DNR   Diet Recommendation: Soft diet for comfort only with feeding assistance and full aspiration precautions  CC - SOB   Brief history of present illness from the day of admission and additional interim summary    Patient is a 79 y.o. male with PMHx of COPD, CAD, HTN who presented to [facility] on 1/11 with fever, cough, shortness of breath-he initially was on 3 L of oxygen-however post admission-his hypoxemia worsened needing 15 L of HFNC-patient was subsequently transferred to [facility] on 1/12.  See below for further details.                                                                 Hospital Course   Acute Hypoxic Resp Failure due to Covid 19 Viral pneumonia: He had severe disease and was treated with IV steroids, remdesivir and Actemra.  He continued to suffer with severe hypoxia and did not improve, he was transitioned to full comfort care and will be discharged to residential hospice.  I have been assured by social work department that receiving facility has the means of providing him with adequate oxygen supply. Plan was discussed with weekend social worker for [facility] Mr. William Wilcox by me on 04/30/2019 at 1:30 PM and subsequently with his supervisor as well.  And again on 05/01/2019.  All of  care will be full comfort now.  SpO2: (!) 89 % O2 Flow Rate (L/min): 15 L/min FiO2 (%): 100 %  Recent Labs  Lab 04/25/19 1315 04/26/19 0103 04/26/19 0103 04/27/19 0038 04/28/19 0150 04/29/19 0135 04/30/19 0154 04/30/19 0155 05/01/19 0415  CRP  --  19.9*   < > 13.9* 7.5* 3.4* 1.4*  --  5.8*  DDIMER  --  0.67*   < > 1.39* 2.51* 2.49* 3.55*  --  7.32*  FERRITIN  --  1,063*  --  837* 439* 425*  --   --   --   BNP 47.3  --   --   --   --   --   --  69.8 122.8*   < > = values in this interval not displayed.    Hepatic Function Latest Ref Rng & Units 05/01/2019 04/30/2019 04/29/2019  Total Protein 6.5 - 8.1 g/dL 6.0(L) 5.7(L) 5.8(L)  Albumin 3.5 - 5.0 g/dL 2.9(L) 2.9(L) 2.9(L)  AST 15 - 41 U/L 37 32 30  ALT 0 - 44 U/L '23 21 20  ' Alk Phosphatase 38 - 126 U/L 153(H) 104 96  Total Bilirubin 0.3 - 1.2 mg/dL 0.6 0.6 0.6    Other medical issues addressed this admission are below.  Now goal of care comfort only.  AKI on CKD stage IIIa: Resolved after hydration but now developing rails requiring IV fluids, again condition is tenuous.  Will have to ignore AKI and hyponatremia for now.  Chronic diastolic heart failure: Mild acute decompensation on 04/30/2019, Lasix and monitor.  COPD: Not in exacerbation-continue inhalers  History of CAD s/p CABG: No anginal symptoms-continue aspirin and statin  HTN: BP on the soft side-continue to hold all antihypertensive agents for now.  Depression/anxiety: Appears stable-continue Zyprexa, Zoloft trazodone and Xanax.  Deconditioning/debility: Appears very frail-has significant atrophy of his calf muscles.  Not sure what his ambulatory status is baseline is.  Obtaining PT/OT.  Failure to thrive syndrome: Appears to be very frail at baseline-with more weakness-and very poor appetite.  Have asked RN to see if we can help him with feeding-continue mobilization with PT/RN-out of bed to chair etc.   Discharge diagnosis     Principal Problem:    Acute hypoxemic respiratory failure due to severe acute respiratory syndrome coronavirus 2 (SARS-CoV-2) disease ([place]) Active Problems:   Major depressive disorder, single episode, severe without psychotic features ([place])   COPD (chronic obstructive pulmonary disease) ([place])   Hypertension   Dyslipidemia   Tobacco use disorder   Major depressive disorder, recurrent severe without psychotic features [facility])    Discharge instructions    Discharge Instructions    Discharge instructions   Complete by: As directed    Disposition.  Residential hospice Condition.  Guarded CODE STATUS.  DNR Activity.  With assistance as tolerated, full fall precautions. Diet.  Soft with feeding assistance and aspiration precautions. Goal of care.  Comfort.      Discharge Medications   Allergies as of 04/16/2019   No Known Allergies     Medication List    STOP taking these medications   ammonium lactate 12 % lotion Commonly known as: LAC-HYDRIN   apixaban 2.5 MG Tabs tablet Commonly known as: ELIQUIS   ascorbic acid 500 MG/5ML syrup Commonly known as: VITAMIN C   aspirin EC 81 MG tablet   dexamethasone 10 MG/ML injection Commonly known as: DECADRON   dexamethasone 6 MG tablet Commonly known as: DECADRON   lisinopril 20 MG tablet Commonly known as: ZESTRIL   Melatonin 3 MG Tabs   prazosin 5 MG capsule Commonly known as: MINIPRESS   sertraline 100 MG tablet Commonly known as: ZOLOFT   simvastatin 40 MG tablet Commonly known as: ZOCOR   Vitamin D 50 MCG (2000 UT) Caps   zinc sulfate 220 (50 Zn) MG capsule     TAKE these medications   acetaminophen 325 MG tablet Commonly known as: TYLENOL Take 650 mg by mouth every 4 (four) hours as needed for mild pain or fever.   ALPRAZolam 0.25 MG tablet Commonly known as: XANAX Take 0.25 mg by mouth at bedtime as needed for anxiety.   alum & mag hydroxide-simeth 200-200-20 MG/5ML suspension Commonly known as: MAALOX/MYLANTA Take 30  mLs by mouth 4 (four) times daily as needed for indigestion or heartburn.   bacitracin-polymyxin b ointment Commonly known as: POLYSPORIN Apply topically 2 (two) times daily.   chlorpheniramine-HYDROcodone 10-8 MG/5ML [employer]  Commonly known as: TUSSIONEX Take 5 mLs by mouth every 12 (twelve) hours as needed for cough.   Combivent Respimat 20-100 MCG/ACT Aers respimat Generic drug: Ipratropium-Albuterol Inhale 1 puff into the lungs every 6 (six) hours.   ipratropium-albuterol 0.5-2.5 (3) MG/3ML Soln Commonly known as: DUONEB Hold while inpatient.   diphenhydrAMINE 2 % cream Commonly known as: BENADRYL Apply 1 application topically 2 (two) times daily. (apply to arms and lower back)   diphenhydrAMINE 25 MG tablet Commonly known as: BENADRYL Take 25 mg by mouth every 6 (six) hours as needed for allergies.   EPINEPHrine 0.3 mg/0.3 mL Soaj injection Commonly known as: EPI-PEN Inject 0.3 mg into the muscle as needed for anaphylaxis.   guaiFENesin-dextromethorphan 100-10 MG/5ML syrup Commonly known as: ROBITUSSIN DM Take 15 mLs by mouth every 6 (six) hours as needed for cough.   loperamide 2 MG capsule Commonly known as: IMODIUM Take 2 mg by mouth as needed for diarrhea or loose stools.   magnesium hydroxide 400 MG/5ML suspension Commonly known as: MILK OF MAGNESIA Take 30 mLs by mouth daily as needed for mild constipation.   morphine CONCENTRATE 10 MG/0.5ML Soln concentrated solution Take 0.5 mLs (10 mg total) by mouth every 3 (three) hours as needed for moderate pain or severe pain.   multivitamin-lutein Caps capsule Take 1 capsule by mouth daily.   nitroGLYCERIN 0.4 MG SL tablet Commonly known as: NITROSTAT Place 0.4 mg under the tongue every 5 (five) minutes as needed for chest pain.   OLANZapine 2.5 MG tablet Commonly known as: ZYPREXA Take 2.5 mg by mouth at bedtime.   tiotropium 18 MCG inhalation capsule Commonly known as: [place] while inpatient.     traMADol 50 MG tablet Commonly known as: ULTRAM Take 50 mg by mouth every 12 (twelve) hours as needed for moderate pain.   traZODone 50 MG tablet Commonly known as: DESYREL Take 50 mg by mouth at bedtime.   triamcinolone cream 0.1 % Commonly known as: KENALOG Apply 1 application topically 2 (two) times daily.   trolamine salicylate 10 % cream Commonly known as: ASPERCREME Apply 1 application topically as needed for muscle pain.         Major procedures and Radiology Reports - PLEASE review detailed and final reports thoroughly  -       DG Chest Port 1 View  Result Date: 04/23/2019 CLINICAL DATA:  Fever. EXAM: PORTABLE CHEST 1 VIEW COMPARISON:  February 01, 2019. FINDINGS: Stable cardiomediastinal silhouette. Status post coronary bypass graft. No pneumothorax or pleural effusion is noted. Mild bilateral ill-defined airspace opacities are noted concerning for edema or possibly multifocal pneumonia. Bony thorax is unremarkable. IMPRESSION: Mild bilateral ill-defined airspace opacities are noted concerning for edema or possibly multifocal pneumonia. Followup radiographs are recommended until resolution. Electronically Signed   By: Marijo Conception M.D.   On: 04/23/2019 10:25    Micro Results     Recent Results (from the past 240 hour(s))  Blood Culture (routine x 2)     Status: None   Collection Time: 04/23/19 10:31 AM   Specimen: BLOOD  Result Value Ref Range Status   Specimen Description BLOOD LEFT ANTECUBITAL  Final   Special Requests   Final    BOTTLES DRAWN AEROBIC AND ANAEROBIC Blood Culture results may not be optimal due to an excessive volume of blood received in culture bottles   Culture   Final    NO GROWTH 5 DAYS Performed at [facility], [place],  [place], [place] [address]    Report Status 04/28/2019 FINAL  Final  Blood Culture (routine x 2)     Status: None   Collection Time: 04/23/19 10:31 AM   Specimen: BLOOD  Result Value Ref Range Status    Specimen Description BLOOD BLOOD RIGHT FOREARM  Final   Special Requests   Final    BOTTLES DRAWN AEROBIC AND ANAEROBIC Blood Culture results may not be optimal due to an excessive volume of blood received in culture bottles   Culture   Final    NO GROWTH 5 DAYS Performed at [facility], [address], [place], [place] [address]    Report Status 04/28/2019 FINAL  Final  Urine culture     Status: Abnormal   Collection Time: 04/23/19  5:08 PM   Specimen: In/Out Cath Urine  Result Value Ref Range Status   Specimen Description   Final    IN/OUT CATH URINE Performed at [facility], [address] [place], [place] [address]    Special Requests   Final    NONE Performed at [facility], [address] [place],  [address]    Culture MULTIPLE SPECIES PRESENT, SUGGEST RECOLLECTION (A)  Final   Report Status 04/25/2019 FINAL  Final    Today   Subjective    William Wilcox today in bed, minimally responsive, in no distress.   Objective   Blood pressure (!) 158/58, pulse (!) 118, temperature 98.2 F (36.8 C), temperature source Oral, resp. rate (!) 22, height '5\' 5"'  (1.651 m), weight 57 kg, SpO2 (!) 89 %.   Intake/Output Summary (Last 24 hours) at 04/13/2019 0936 Last data filed at 05/06/2019 0500 Gross per 24 hour  Intake --  Output 851 ml  Net -851 ml    Exam Awake minimally responsive, appears to be slightly short of breath, .AT,PERRAL Supple Neck,No JVD, No cervical lymphadenopathy appriciated.  Symmetrical Chest wall movement, coarse bilateral breath sounds RRR,No Gallops,Rubs or new Murmurs, No Parasternal Heave +ve B.Sounds, Abd Soft, Non tender, No organomegaly appriciated, No rebound -guarding or rigidity. No Cyanosis,     Data Review   CBC w Diff:  Lab Results  Component Value Date   WBC 15.7 (H) 05/01/2019   HGB 11.6 (L) 05/01/2019   HGB 15.0 02/15/2014   HCT 35.1 (L) 05/01/2019   HCT 45.3 02/15/2014   PLT 170  05/01/2019   PLT 171 02/15/2014   LYMPHOPCT 19 05/01/2019   MONOPCT 2 05/01/2019   EOSPCT 1 05/01/2019   BASOPCT 0 05/01/2019    CMP:  Lab Results  Component Value Date   NA 153 (H) 05/01/2019   NA 142 02/15/2014   K 3.6 05/01/2019   K 4.1 02/15/2014   CL 121 (H) 05/01/2019   CL 112 (H) 02/15/2014   CO2 22 05/01/2019   CO2 24 02/15/2014   BUN 69 (H) 05/01/2019   BUN 14 02/15/2014   CREATININE 1.46 (H) 05/01/2019   CREATININE 1.32 (H) 02/15/2014   PROT 6.0 (L) 05/01/2019   PROT 7.7 02/15/2014   ALBUMIN 2.9 (L) 05/01/2019   ALBUMIN 3.6 02/15/2014   BILITOT 0.6 05/01/2019   BILITOT 0.5 02/15/2014   ALKPHOS 153 (H) 05/01/2019   ALKPHOS 100 02/15/2014   AST 37 05/01/2019   AST 26 02/15/2014   ALT 23 05/01/2019   ALT 19 02/15/2014  .   Total Time in preparing paper work, data evaluation and todays exam - 31 minutes  Lala Lund M.D on 05/05/2019  at 9:36 AM  [facility]   Office  [phone]

## 2019-05-02 NOTE — Progress Notes (Signed)
Pt splitting out oral rinse and dry skin while this RN attempts to do oral care. Asked pt if he will be able/or willing to take his hs/pm meds by mouth, if this RN crushes them as last night, pt shakes head and states NO.  PO HS meds not given d/t pts above reaction/response.

## 2019-05-02 NOTE — Progress Notes (Signed)
[facility] being [facility]. Family informed of transfer during "End of Life" visit with both daughters William Wilcox and William Wilcox). Pt on both HF and NRB at 15L O2 ranging from 82-92%, non labored breathing. Pt non-responsive, pupil slowly reactive. IV to remain in forearm also condom cath. Pt to be transported by Ptar.

## 2019-05-02 NOTE — TOC Transition Note (Addendum)
Transition of Care [facility] - CM/SW Discharge Note   Patient Details  Name: William Wilcox MRN: [id number] Date of Birth: 08/02/1940  [facility] CM/SW Contact:  Golda Acre, RN Phone Number: 2019/05/13, 10:46 AM   Clinical Narrative:    [facility]ar called at 1045 for pickup to transfer to [facility] at 1330 to allow family time to visit. [facility] [place] [facility] [address] [place] [address]. Number for rn to call report to is [phone]. Floor rn notified.  Final next level of care: Hospice Medical Facility Barriers to Discharge: Barriers Resolved   Patient Goals and CMS Choice Patient states their goals for this hospitalization and ongoing recovery are:: to go to residential hospice CMS Medicare.gov Compare Post Acute Care list provided to:: Legal Guardian Choice offered to / list presented to : [facility] POA / Guardian  Discharge Placement                       Discharge Plan and Services   Discharge Planning Services: CM Consult Post Acute Care Choice: Hospice                               Social Determinants of Health (SDOH) Interventions     Readmission Risk Interventions No flowsheet data found.

## 2019-05-02 NOTE — Progress Notes (Signed)
Dr Karren Burly ordered dilaudid for pt, will administer and evaluate pts response, pt currently extremely stiff with turning and positioning, grimacing and calls out/moans with nursing care.

## 2019-05-02 NOTE — Progress Notes (Signed)
PMT consult received and chart reviewed. Patient has transitioned to comfort measures and waiting on hospice bed. Per Dr. Thedore Mins, no palliative needs at this time. Thank you.   NO CHARGE  Vennie Homans, DNP, FNP-C Palliative Medicine Team  Phone: [phone] Fax: [phone]

## 2019-05-02 NOTE — Discharge Instructions (Signed)
Disposition.  Residential hospice °Condition.  Guarded °CODE STATUS.  DNR °Activity.  With assistance as tolerated, full fall precautions. °Diet.  Soft with feeding assistance and aspiration precautions. °Goal of care.  Comfort. ° °

## 2019-05-02 NOTE — Progress Notes (Signed)
Pt comfortable this am, Dilaudid given as per md this early morning w/good results, pt not grimacing and moaning w/repositioning after med given. Dr Karren Burly aware of pts fixed stare, although PERRLA, pt is very slow to respond, needing evaluation for care and orders offering more comfort for pt. Pt VSS, bp wnl, tolerates pain med w/o concern. Oral care done w/majic mouthwash as well, pt tolerated fairly well. Pt refusing/unable to take PO meds. IV site Rwrist patent, dressing changed, site unremarkable and patent, saline locked. Tele d/c'd last evening, Pox continues, pt falls into low 80's at times, but recovers quickly. Skin w/o issues, Kerofoam continues on sacrum. Condom cath patent, urine output sufficient, clear ylw/amber. No bowel movement last shift.  Family calls to check on pts status, aware needs to follow up with Casemanager/SW.  Report at bedside with Leeanne Deed RN. MD's needing to be in today to discuss/review meds for IV instead of PO/IM administration

## 2019-05-14 DEATH — deceased

## 2019-06-15 IMAGING — CR DG HIP (WITH OR WITHOUT PELVIS) 2-3V*L*
1 series · 3 of 3 positions shown · non-contrast
Comparison: AP pelvis x-ray May 22, 2009

CLINICAL DATA: Left hip pain after falling on the left side today.

EXAM:
DG HIP (WITH OR WITHOUT PELVIS) 2-3V LEFT

[Series 1: dg hip unilat w or w/o pelvis 2-3 views  · non-contrast · 0.14mm/px · 3 of 3 slices shown]
[im 1/3]
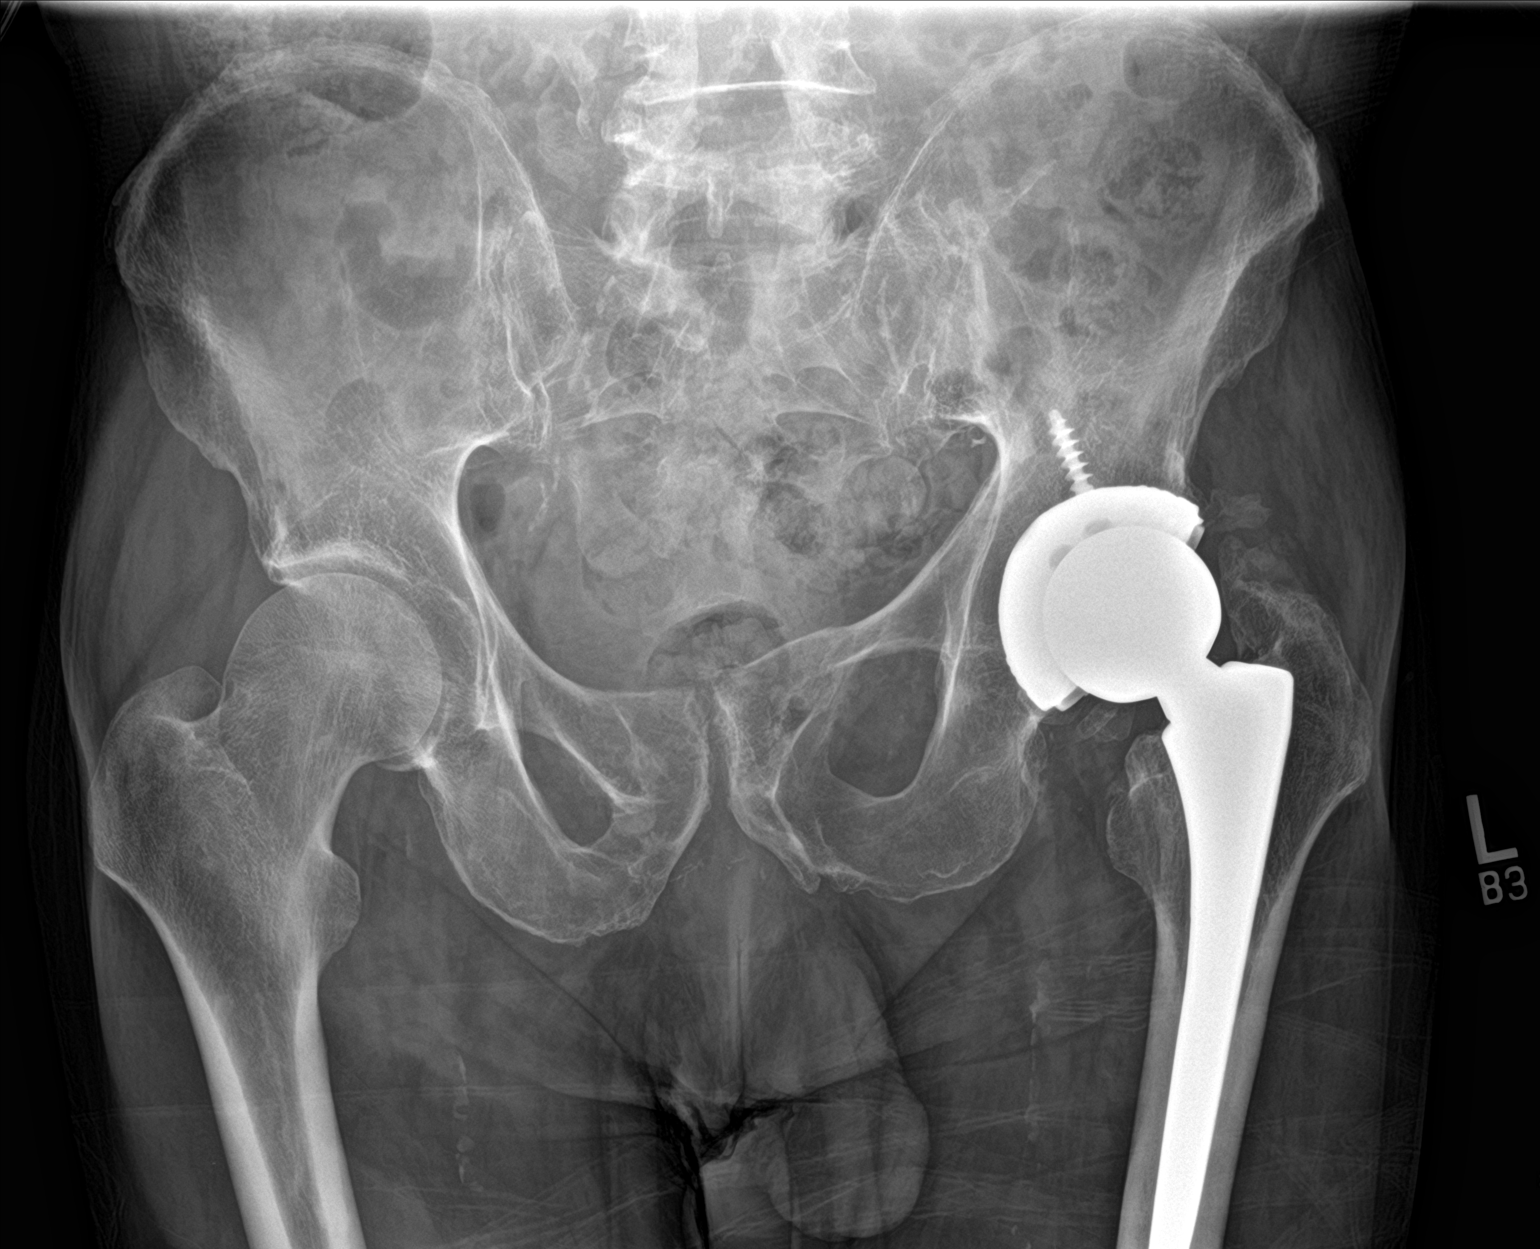
[im 2/3]
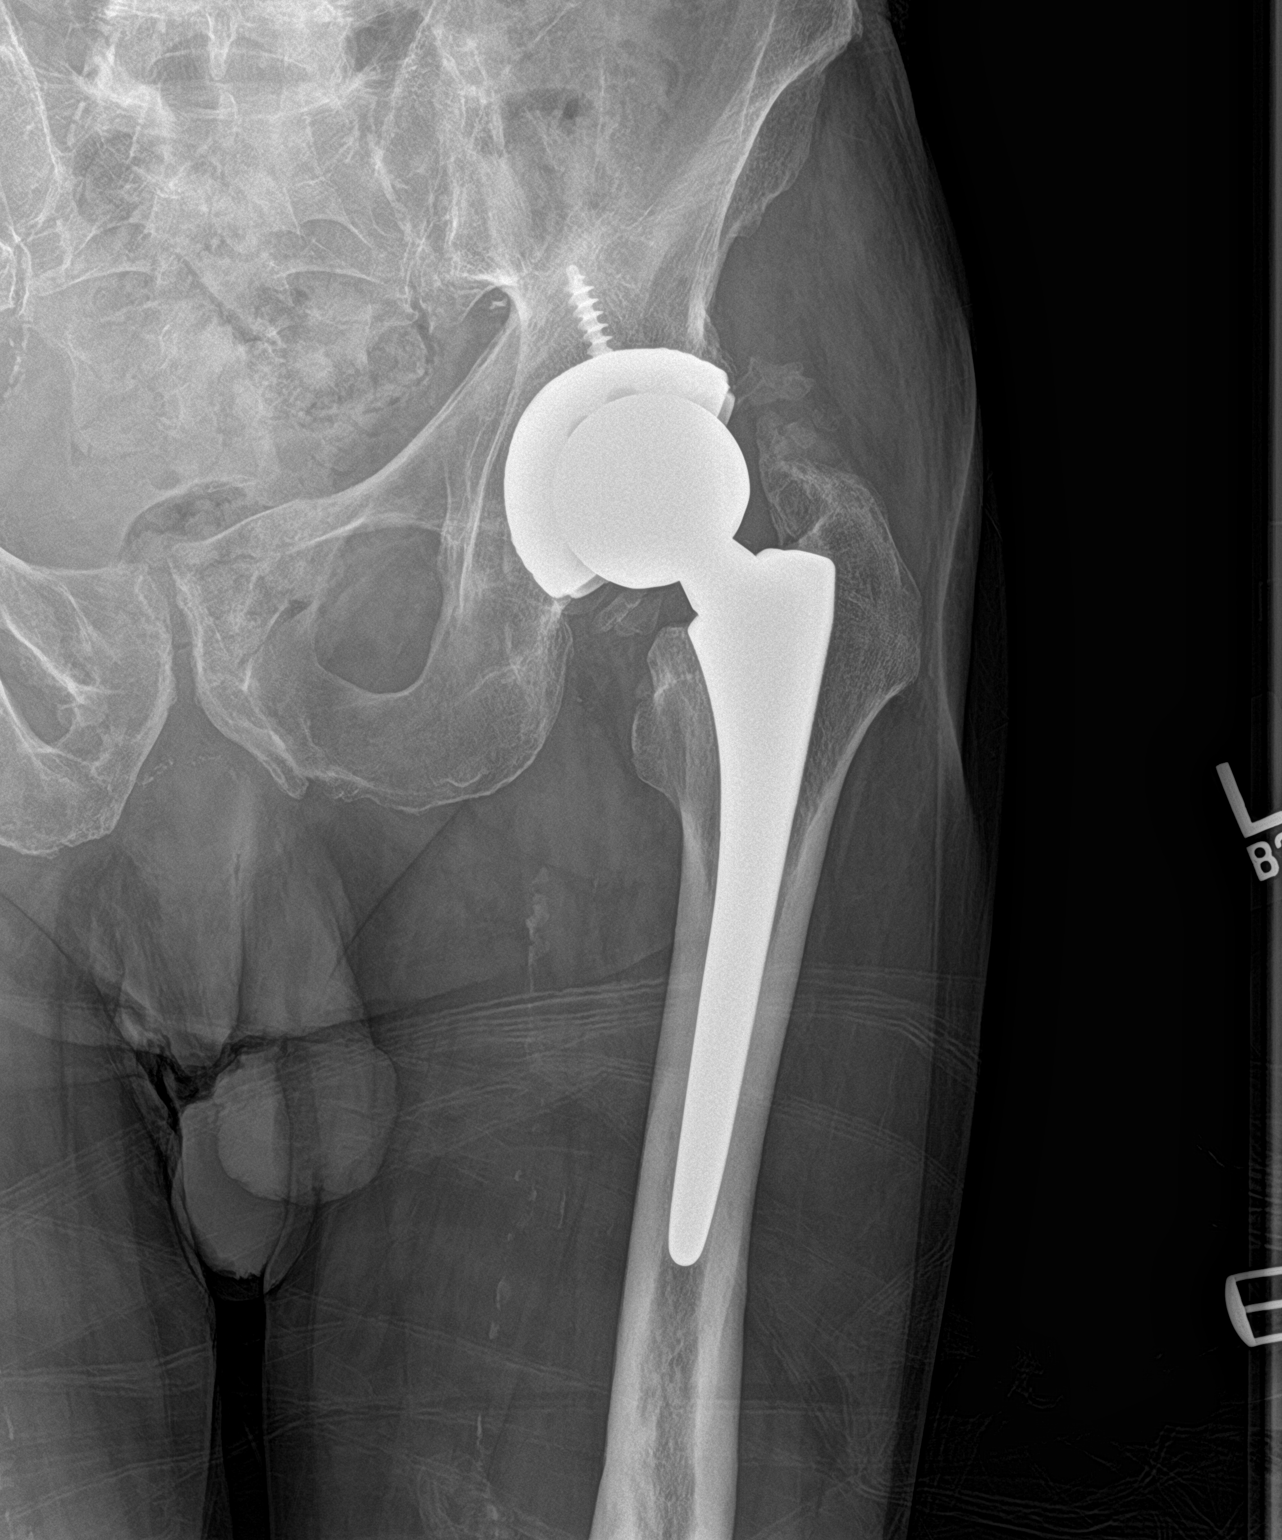
[im 3/3]
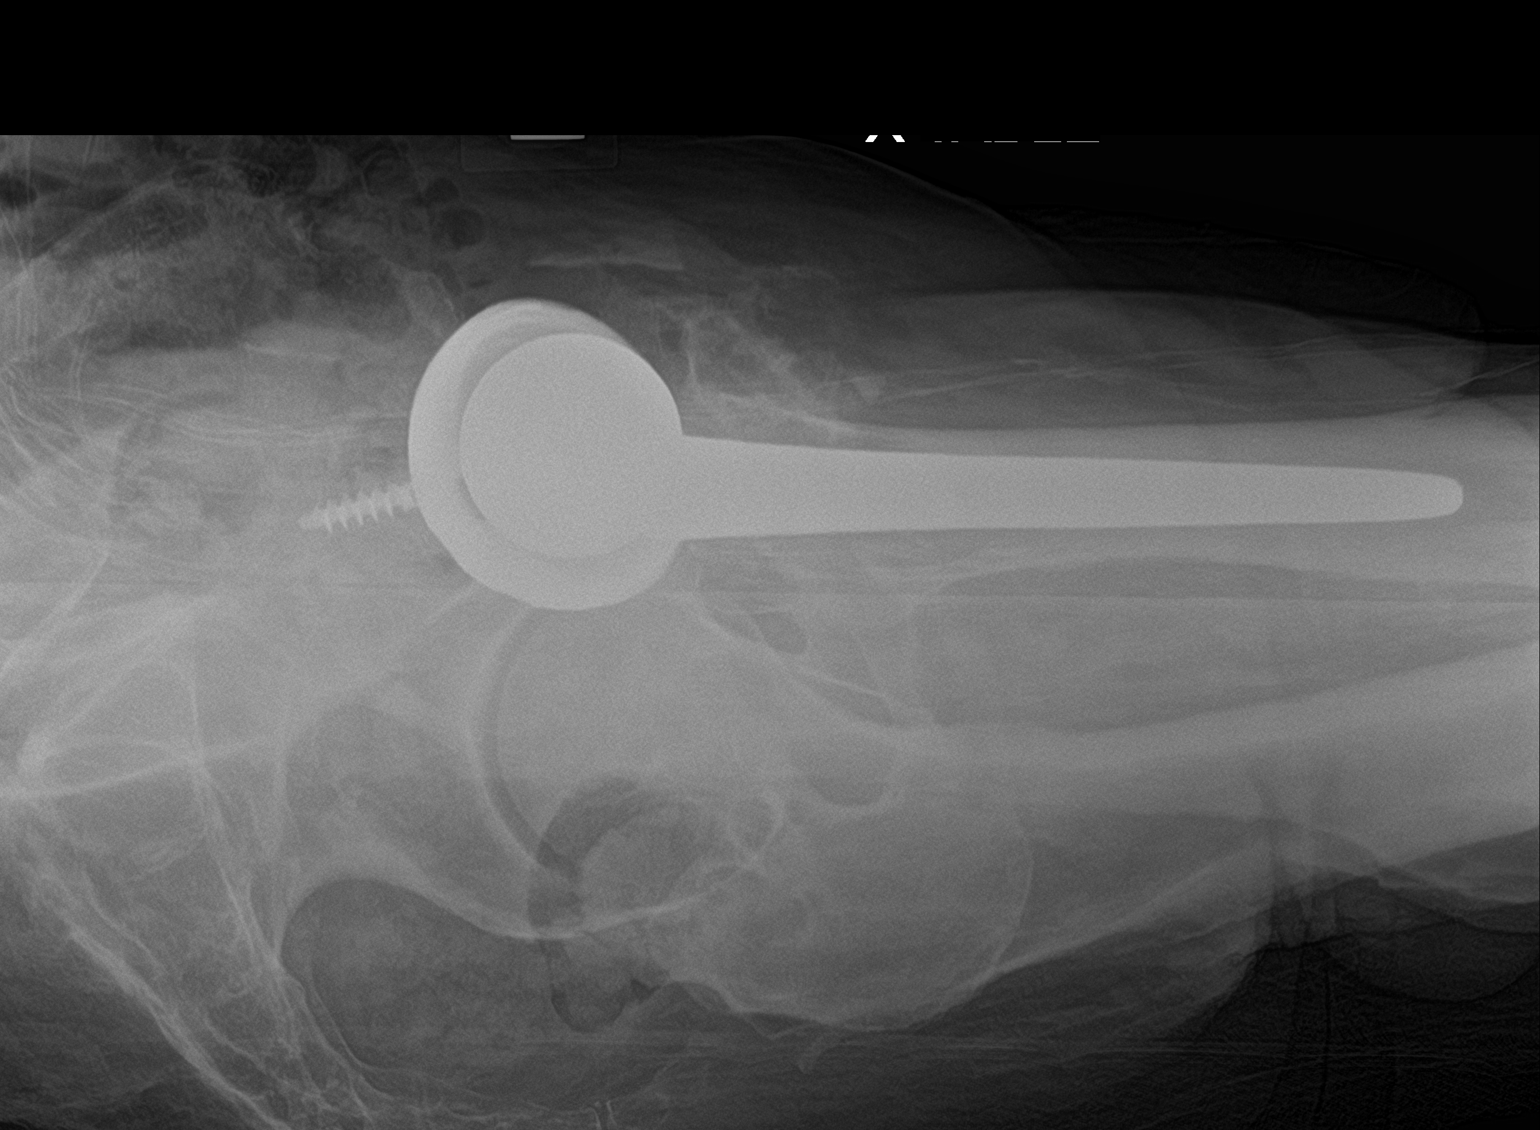

[3 of 3 positions shown; findings below may reference images not displayed]

FINDINGS: The patient has undergone previous placement of a prosthetic left
hip joint. Radiographic positioning of the prosthetic components is
good. The interface with the native bone appears normal. The left
hemipelvis is intact. There is old deformity of the left superior
and inferior pubic rami. The right hemipelvis appears normal where
visualized.
IMPRESSION: There is no acute abnormality of the prosthetic left hip nor of the
native bone.

## 2019-06-23 IMAGING — CT CT HEAD W/O CM
3 series · 16 of 47 positions shown, 19 images · non-contrast
Comparison: None.

CLINICAL DATA: 77-year-old male with visual loss and left eye
blurriness.

EXAM:
CT HEAD WITHOUT CONTRAST
TECHNIQUE: Contiguous axial images were obtained from the base of the skull
through the vertex without intravenous contrast.

[Series 3: head wo · axial · 0.40mm/px · z∈[-141,-16]mm · 10 of 30 slices shown, 13 images]
[im 3/30  brain]
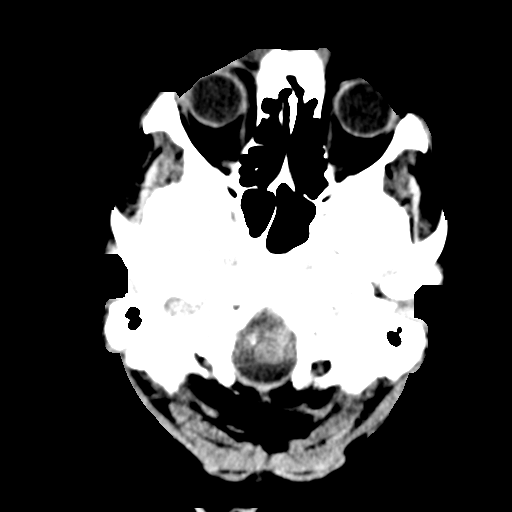
[im 3/30  bone]
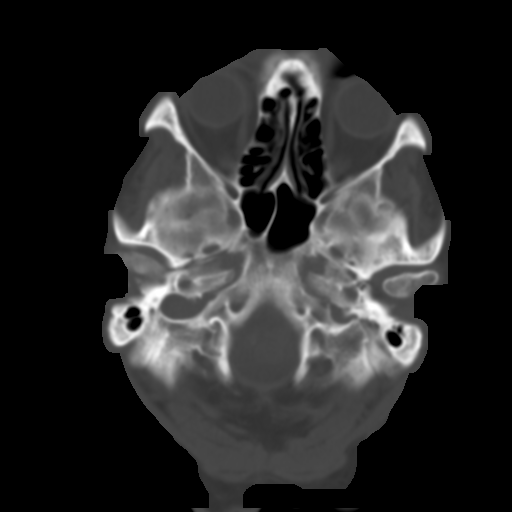
[im 6/30  brain]
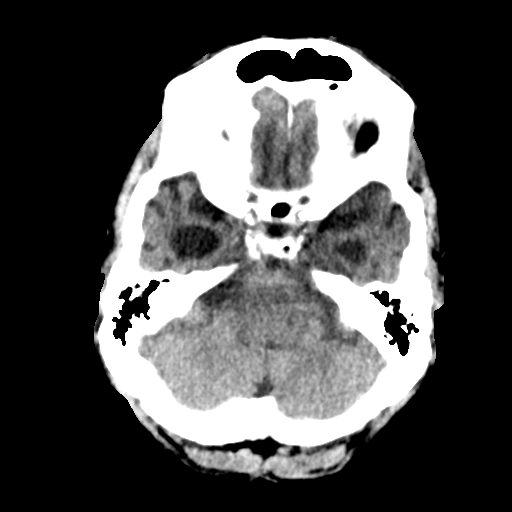
[im 9/30  brain]
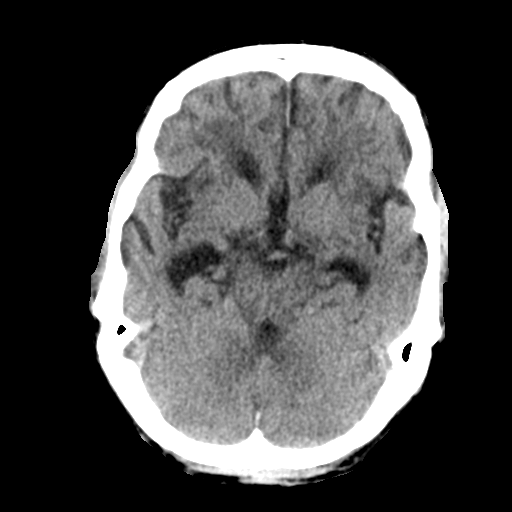
[im 11/30  brain]
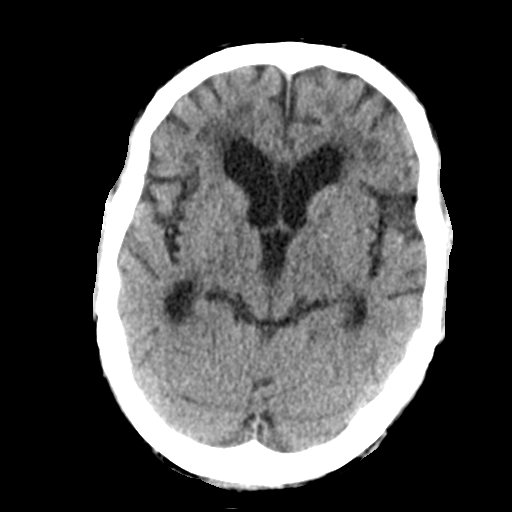
[im 14/30  brain]
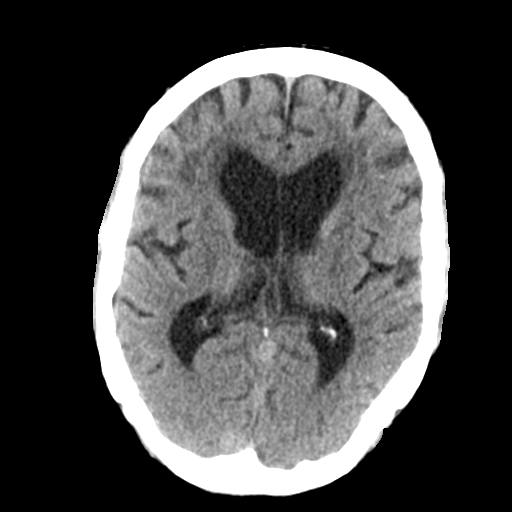
[im 14/30  bone]
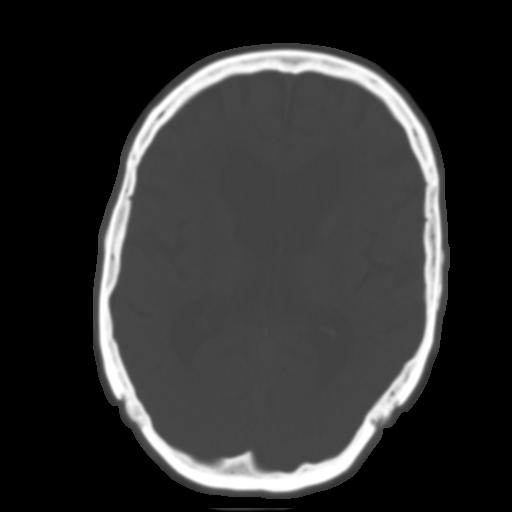
[im 17/30  brain]
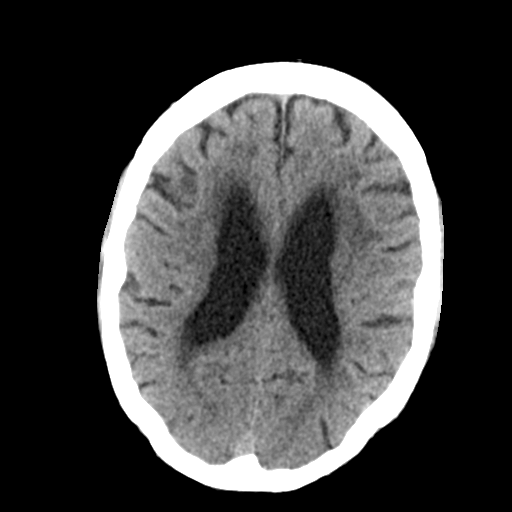
[im 20/30  brain]
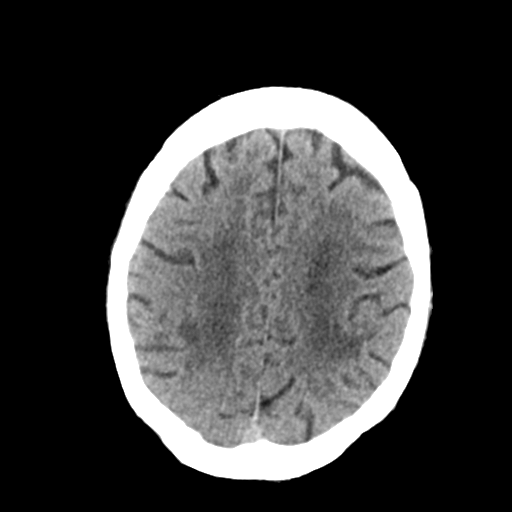
[im 23/30  brain]
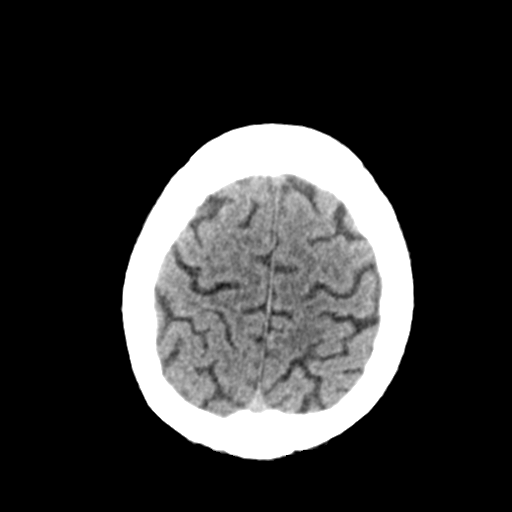
[im 25/30  brain]
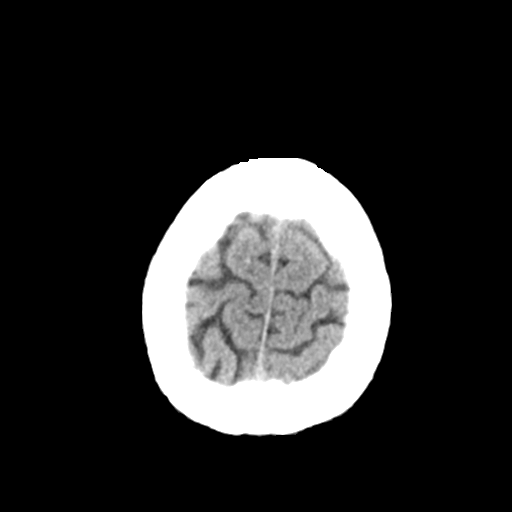
[im 25/30  bone]
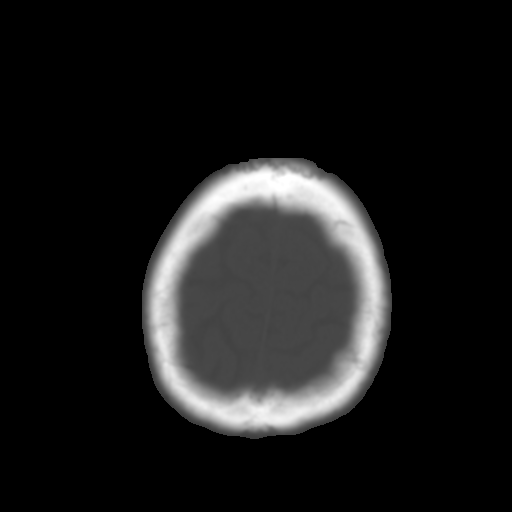
[im 28/30  brain]
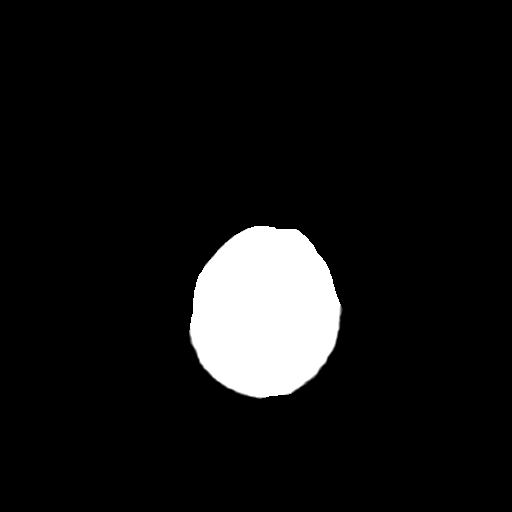

[Series 4: coronal soft tissue · coronal · 0.29mm/px · 3 of 62 slices shown]
[im 21/62  brain]
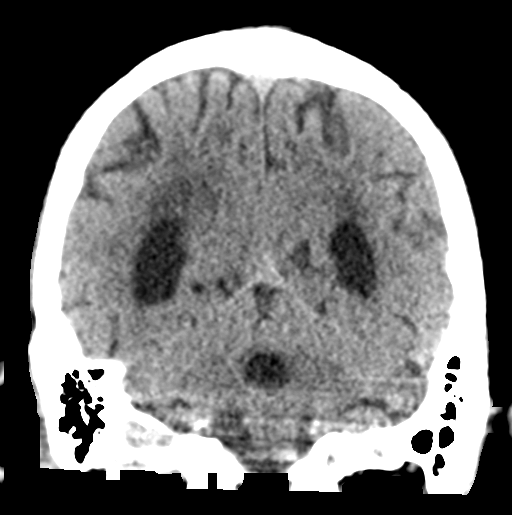
[im 28/62  brain]
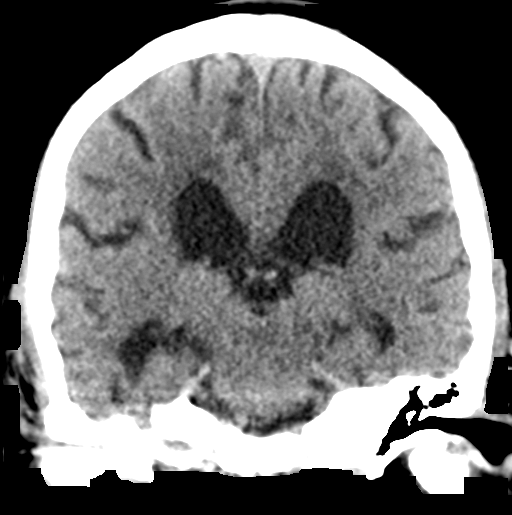
[im 34/62  brain]
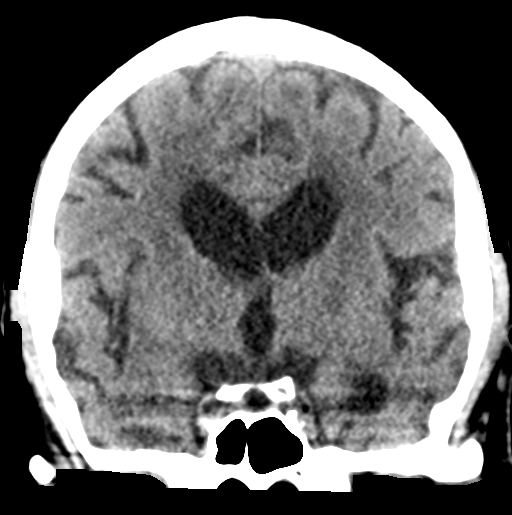

[Series 5: sagittal soft tissue · sagittal · 0.29mm/px · 3 of 46 slices shown]
[im 16/46  brain]
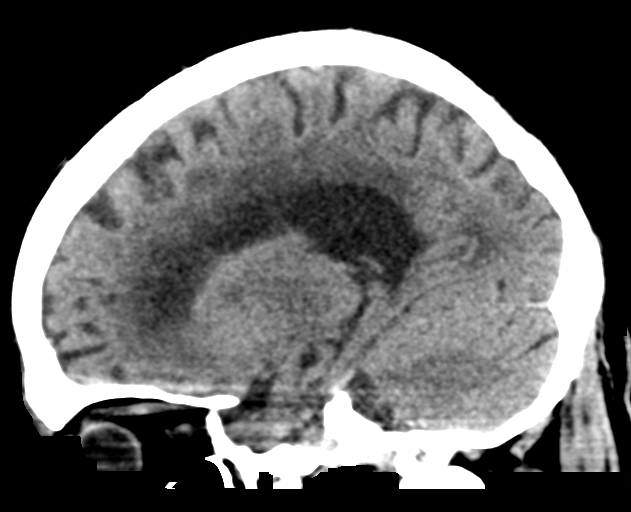
[im 23/46  brain]
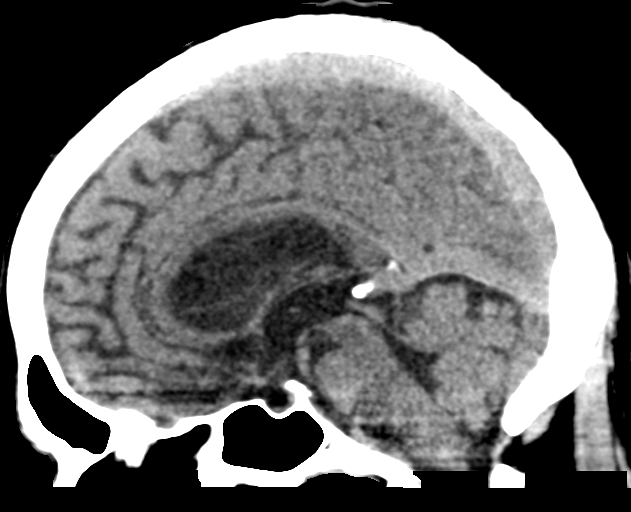
[im 31/46  brain]
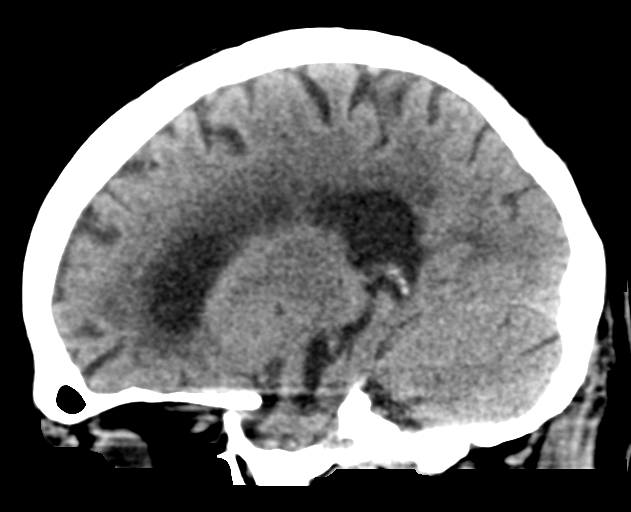

[16 of 47 positions shown; findings below may reference images not displayed]

FINDINGS: Brain: There is mild age-related atrophy and moderate chronic
microvascular ischemic changes. There is no acute intracranial
hemorrhage. No mass effect or midline shift. No extra-axial fluid
collection.

Vascular: No hyperdense vessel or unexpected calcification.

Skull: Normal. Negative for fracture or focal lesion.

Sinuses/Orbits: Mild mucoperiosteal thickening of paranasal sinuses.
No air-fluid levels. The mastoid air cells are clear.

Other: None
IMPRESSION: 1. No acute intracranial hemorrhage.
2. Age-related atrophy and chronic microvascular ischemic changes.
